# Patient Record
Sex: Female | Born: 1965 | Race: White | Hispanic: No | Marital: Married | Smoking: Former smoker
Health system: Southern US, Community
[De-identification: ages and names within clinical notes are randomized; demographics above are authoritative.]

## PROBLEM LIST (undated history)

## (undated) DIAGNOSIS — R609 Edema, unspecified: Secondary | ICD-10-CM

## (undated) DIAGNOSIS — R000 Tachycardia, unspecified: Secondary | ICD-10-CM

## (undated) DIAGNOSIS — M549 Dorsalgia, unspecified: Secondary | ICD-10-CM

## (undated) DIAGNOSIS — R12 Heartburn: Secondary | ICD-10-CM

## (undated) DIAGNOSIS — E785 Hyperlipidemia, unspecified: Secondary | ICD-10-CM

## (undated) DIAGNOSIS — G4700 Insomnia, unspecified: Secondary | ICD-10-CM

## (undated) DIAGNOSIS — R079 Chest pain, unspecified: Secondary | ICD-10-CM

## (undated) DIAGNOSIS — E079 Disorder of thyroid, unspecified: Secondary | ICD-10-CM

## (undated) DIAGNOSIS — J309 Allergic rhinitis, unspecified: Secondary | ICD-10-CM

## (undated) HISTORY — DX: Dorsalgia, unspecified: M54.9

## (undated) HISTORY — DX: Insomnia, unspecified: G47.00

## (undated) HISTORY — DX: Hyperlipidemia, unspecified: E78.5

## (undated) HISTORY — DX: Tachycardia, unspecified: R00.0

## (undated) HISTORY — DX: Edema, unspecified: R60.9

## (undated) HISTORY — PX: WISDOM TOOTH EXTRACTION: SHX21

## (undated) HISTORY — DX: Disorder of thyroid, unspecified: E07.9

## (undated) HISTORY — DX: Chest pain, unspecified: R07.9

## (undated) HISTORY — DX: Allergic rhinitis, unspecified: J30.9

## (undated) HISTORY — DX: Heartburn: R12

---
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
## 1998-03-21 ENCOUNTER — Other Ambulatory Visit: Admission: RE | Admit: 1998-03-21 | Discharge: 1998-03-21 | Payer: Self-pay | Admitting: Obstetrics and Gynecology

## 1998-10-16 ENCOUNTER — Inpatient Hospital Stay (HOSPITAL_COMMUNITY): Admission: AD | Admit: 1998-10-16 | Discharge: 1998-10-20 | Payer: Self-pay | Admitting: Obstetrics and Gynecology

## 1998-12-01 ENCOUNTER — Other Ambulatory Visit: Admission: RE | Admit: 1998-12-01 | Discharge: 1998-12-01 | Payer: Self-pay | Admitting: Obstetrics and Gynecology

## 2000-03-30 ENCOUNTER — Other Ambulatory Visit: Admission: RE | Admit: 2000-03-30 | Discharge: 2000-03-30 | Payer: Self-pay | Admitting: Obstetrics and Gynecology

## 2000-09-18 ENCOUNTER — Emergency Department (HOSPITAL_COMMUNITY): Admission: EM | Admit: 2000-09-18 | Discharge: 2000-09-18 | Payer: Self-pay | Admitting: Emergency Medicine

## 2001-06-09 ENCOUNTER — Other Ambulatory Visit: Admission: RE | Admit: 2001-06-09 | Discharge: 2001-06-09 | Payer: Self-pay | Admitting: Obstetrics and Gynecology

## 2002-10-04 ENCOUNTER — Other Ambulatory Visit: Admission: RE | Admit: 2002-10-04 | Discharge: 2002-10-04 | Payer: Self-pay | Admitting: Obstetrics and Gynecology

## 2002-11-29 ENCOUNTER — Encounter: Payer: Self-pay | Admitting: Emergency Medicine

## 2002-11-29 ENCOUNTER — Emergency Department (HOSPITAL_COMMUNITY): Admission: EM | Admit: 2002-11-29 | Discharge: 2002-11-29 | Payer: Self-pay | Admitting: Emergency Medicine

## 2003-11-28 ENCOUNTER — Other Ambulatory Visit: Admission: RE | Admit: 2003-11-28 | Discharge: 2003-11-28 | Payer: Self-pay | Admitting: Obstetrics and Gynecology

## 2004-12-16 ENCOUNTER — Other Ambulatory Visit: Admission: RE | Admit: 2004-12-16 | Discharge: 2004-12-16 | Payer: Self-pay | Admitting: Obstetrics and Gynecology

## 2011-04-06 ENCOUNTER — Inpatient Hospital Stay (HOSPITAL_COMMUNITY)
Admission: AD | Admit: 2011-04-06 | Discharge: 2011-04-07 | Disposition: A | Discharge status: Home Health | Payer: BC Managed Care – PPO | Source: Ambulatory Visit | Attending: Obstetrics and Gynecology | Admitting: Obstetrics and Gynecology

## 2011-04-06 ENCOUNTER — Encounter (HOSPITAL_COMMUNITY): Payer: Self-pay

## 2011-04-06 DIAGNOSIS — N898 Other specified noninflammatory disorders of vagina: Secondary | ICD-10-CM

## 2011-04-06 DIAGNOSIS — R109 Unspecified abdominal pain: Secondary | ICD-10-CM | POA: Insufficient documentation

## 2011-04-06 DIAGNOSIS — N939 Abnormal uterine and vaginal bleeding, unspecified: Secondary | ICD-10-CM

## 2011-04-06 LAB — CBC
Hemoglobin: 13 g/dL (ref 12.0–15.0)
MCH: 28.1 pg (ref 26.0–34.0)
MCHC: 33.5 g/dL (ref 30.0–36.0)
MCV: 84 fL (ref 78.0–100.0)
Platelets: 223 10*3/uL (ref 150–400)
RDW: 13.4 % (ref 11.5–15.5)
WBC: 8.2 10*3/uL (ref 4.0–10.5)

## 2011-04-06 MED ORDER — NAPROXEN SODIUM 550 MG PO TABS: 550.0000 mg | ORAL_TABLET | Freq: Two times a day (BID) | ORAL | Status: AC

## 2011-04-06 MED ORDER — MEDROXYPROGESTERONE ACETATE 5 MG PO TABS: 10.0000 mg | ORAL_TABLET | Freq: Every day | ORAL | Status: DC

## 2011-04-06 NOTE — Discharge Instructions (Signed)
Dysfunctional Uterine Bleeding (DUB) (Abnormal Uterine Bleeding [AUB]) Normally menstrual periods begin between ages 9 to 42 in young women. A normal menstrual cycle/period may begin every 23 days up to 35 days and lasts from 1 to 7 days. Around 12 to 14 days before your menstrual period starts, ovulation (ovary produces an egg) occurs. When counting the time between menstrual periods, count from the first day of bleeding of the previous period to the first day of bleeding of the next period. Dysfunctional (abnormal) uterine bleeding is bleeding that is different from a normal menstrual period. Your periods may come earlier or later than usual. They may be lighter, have blood clots or be heavier. You may have bleeding between periods, or you may skip one period or more. You may have bleeding after sexual intercourse, bleeding after menopause, or no menstrual period. CAUSES  Pregnancy (normal, miscarriage, tubal).  IUDs (intrauterine device, birth control).   Birth control pills.   Hormone treatment.   Menopause.   Infection of the cervix.   Blood clotting problems.   Infection of the inside lining of the uterus.   Endometriosis, inside lining of the uterus growing in the pelvis and other female organs.   Adhesions (scar tissue) inside the uterus.   Obesity or severe weight loss.   Uterine polyps inside the uterus.  Cancer of the vagina, cervix, or uterus.   Ovarian cysts or polycystic ovary syndrome.   Medical problems (diabetes, thyroid disease).   Uterine fibroids (noncancerous tumor).   Problems with your female hormones.   Endometrial hyperplasia, very thick lining and enlarged cells inside of the uterus.   Medicines that interfere with ovulation.   Radiation to the pelvis or abdomen.   Chemotherapy.   DIAGNOSIS  Your doctor will discuss the history of your menstrual periods, medicines you are taking, changes in your weight, stress in your life, and any medical  problems you may have.   Your doctor will do a physical and pelvic examination.   Your doctor may want to perform certain tests to make a diagnosis, such as:   Pap test.  Blood tests.   Cultures for infection.   CT scan.   Ultrasound.  Hysteroscopy.   Laparoscopy.   MRI.   Hysterosalpingography.  D and C.   Endometrial biopsy.   TREATMENT Treatment will depend on the cause of the dysfunctional uterine bleeding (DUB). Treatment may include:  Observing your menstrual periods for a couple of months.   Prescribing medicines for medical problems, including:   Antibiotics.   Hormones.   Birth control pills.   Removing an IUD (intrauterine device, birth control).   Surgery:   D and C (scrape and remove tissue from inside the uterus).   Laparoscopy (examine inside the abdomen with a lighted tube).   Uterine ablation (destroy lining of the uterus with electrical current, laser, heat, or freezing).   Hysteroscopy (examine cervix and uterus with a lighted tube).   Hysterectomy (remove the uterus).  HOME CARE INSTRUCTIONS  If medicines were prescribed, take exactly as directed. Do not change or switch medicines without consulting your caregiver.   Long term heavy bleeding may result in iron deficiency. Your caregiver may have prescribed iron pills. They help replace the iron that your body lost from heavy bleeding. Take exactly as directed.   Do not take aspirin or medicines that contain aspirin one week before or during your menstrual period. Aspirin may make the bleeding worse.   If you need to change  your sanitary pad or tampon more than once every 2 hours, stay in bed with your feet elevated and a cold pack on your lower abdomen. Rest as much as possible, until the bleeding stops or slows down.   Eat well-balanced meals. Eat foods high in iron. Examples are:   Leafy green vegetables.  Whole-grain breads and cereals.   Eggs.   Meat.  Liver.    Do not try  to lose weight until the abnormal bleeding has stopped and your blood iron level is back to normal. Do not lift more than ten pounds or do strenuous activities when you are bleeding.   For a couple of months, make note on your calendar, marking the start and ending of your period, and the type of bleeding (light, medium, heavy, spotting, clots or missed periods). This is for your caregiver to better evaluate your problem.  SEEK MEDICAL CARE IF:  You develop nausea (feeling sick to your stomach) and vomiting, dizziness, or diarrhea while you are taking your medicine.   You are getting lightheaded or weak.   You have any problems that may be related to the medicine you are taking.   You develop pain with your DUB.   You want to remove your IUD.   You want to stop or change your birth control pills or hormones.   You have any type of abnormal bleeding mentioned above.   You are over 6 years old and have not had a menstrual period yet.   You are 45 years old and you are still having menstrual periods.   You have any of the symptoms mentioned above.   You develop a rash.  SEEK IMMEDIATE MEDICAL CARE IF:  An oral temperature above 100.4 develops.   You develop chills.   You are changing your sanitary pad or tampon more than once an hour.   You develop abdominal pain.   You pass out or faint.  Document Released: 08/20/2000 Document Re-Released: 11/17/2009 [facility] Patient Information 2011 [place], [place].

## 2011-04-06 NOTE — ED Provider Notes (Signed)
History    patient is a 45 year old white female who presents today complaining of irregular vaginal bleeding. She had her regular menses on July 3. However she states she began bleeding yesterday and now bleeding extremely heavily with lower abdominal  cramping. She denies fever. She denies any other problems at this time.  Chief Complaint  Patient presents with  . Vaginal Bleeding   HPI  OB History    Grav Para Term Preterm Abortions TAB SAB Ect Mult Living   2 2        2       Past Medical History  Diagnosis Date  . Lymphedema of leg     right leg swelling    Past Surgical History  Procedure Date  . Wisdom tooth extraction     Family History  Problem Relation Age of Onset  . Cancer Mother   . Atrial fibrillation Mother   . Kidney disease Mother     History  Substance Use Topics  . Smoking status: Never Smoker   . Smokeless tobacco: Not on file  . Alcohol Use: No    Allergies:  Allergies  Allergen Reactions  . Penicillins Rash    Childhood reactions    Prescriptions prior to admission  Medication Sig Dispense Refill  . zolpidem (AMBIEN) 10 MG tablet Take 5 mg by mouth at bedtime as needed. sleep         Review of Systems  Constitutional: Negative for fever and chills.  Cardiovascular: Negative for chest pain.  Gastrointestinal: Positive for abdominal pain. Negative for nausea, vomiting, diarrhea and constipation.  Genitourinary: Negative for dysuria, urgency, frequency, hematuria and flank pain.  Neurological: Negative for dizziness and headaches.  Psychiatric/Behavioral: Negative for depression and suicidal ideas.   Physical Exam   Blood pressure 133/92, pulse 98, temperature 98.2 F (36.8 C), temperature source Oral, resp. rate 20, height 5\' 3"  (1.6 m), weight 141 lb (63.957 kg), last menstrual period 04/06/2011, SpO2 97.00%.  Physical Exam  Constitutional: She is oriented to person, place, and time. She appears well-developed and well-nourished.  No distress.  HENT:  Head: Normocephalic and atraumatic.  Eyes: EOM are normal. Pupils are equal, round, and reactive to light.  GI: Soft. She exhibits no distension and no mass. There is no tenderness. There is no rebound and no guarding.  Genitourinary: There is bleeding around the vagina. No tenderness around the vagina. No vaginal discharge found.       Uterus is top normal size. No adnexal masses. She has moderate bleeding on exam.  Neurological: She is alert and oriented to person, place, and time.  Skin: Skin is warm and dry. She is not diaphoretic.  Psychiatric: She has a normal mood and affect. Her behavior is normal. Judgment and thought content normal.    MAU Course  Procedures  Results for orders placed during the hospital encounter of 04/06/11 (from the past 24 hour(s))  CBC     Status: Normal   Collection Time   04/06/11 10:36 PM      Component Value Range   WBC 8.2  4.0 - 10.5 (K/uL)   RBC 4.62  3.87 - 5.11 (MIL/uL)   Hemoglobin 13.0  12.0 - 15.0 (g/dL)   HCT 45.4  09.8 - 11.9 (%)   MCV 84.0  78.0 - 100.0 (fL)   MCH 28.1  26.0 - 34.0 (pg)   MCHC 33.5  30.0 - 36.0 (g/dL)   RDW 14.7  82.9 - 56.2 (%)   Platelets  223  150 - 400 (K/uL)    Assessment and Plan  Irregular vaginal bleeding: Give her prescription for Provera 10 mg take 1 by mouth daily for 10 days. She'll followup with her OB/GYN provider. Also did a prescription for double strength Anaprox to take as needed for pain. She'll return immediately if she begins to have any worsening symptoms or problems.  Clinton Gallant. Lexie Koehl III, DrHSc, MPAS, PA-C  04/06/2011, 11:23 PM   Henrietta Hoover, PA 04/06/11 [id number]

## 2011-04-06 NOTE — Progress Notes (Signed)
Patient is here with c/o heavy vaginal bleeding with clots. She states that it at 1700pm today. She states that she is saturated super tampon and pad every . She is feeling slightly lighheaded. She also c/o moderate lower abdominal cramping.

## 2011-04-06 NOTE — Progress Notes (Signed)
Pt states her last period was Jul 3-states she has started her period now but is hav in heavy bleeding and passing clots

## 2013-01-17 ENCOUNTER — Other Ambulatory Visit: Payer: Self-pay | Admitting: Obstetrics and Gynecology

## 2013-01-17 DIAGNOSIS — N6320 Unspecified lump in the left breast, unspecified quadrant: Secondary | ICD-10-CM

## 2013-01-31 ENCOUNTER — Ambulatory Visit
Admission: RE | Admit: 2013-01-31 | Discharge: 2013-01-31 | Disposition: A | Discharge status: Home / Self Care | Payer: BC Managed Care – PPO | Source: Ambulatory Visit | Attending: Obstetrics and Gynecology | Admitting: Obstetrics and Gynecology

## 2013-01-31 ENCOUNTER — Other Ambulatory Visit: Payer: Self-pay | Admitting: Obstetrics and Gynecology

## 2013-01-31 DIAGNOSIS — N6320 Unspecified lump in the left breast, unspecified quadrant: Secondary | ICD-10-CM

## 2013-02-01 ENCOUNTER — Ambulatory Visit
Admission: RE | Admit: 2013-02-01 | Discharge: 2013-02-01 | Disposition: A | Discharge status: Home / Self Care | Payer: BC Managed Care – PPO | Source: Ambulatory Visit | Attending: Obstetrics and Gynecology | Admitting: Obstetrics and Gynecology

## 2013-02-01 DIAGNOSIS — N6320 Unspecified lump in the left breast, unspecified quadrant: Secondary | ICD-10-CM

## 2013-06-21 ENCOUNTER — Encounter: Payer: Self-pay | Admitting: *Deleted

## 2013-06-21 ENCOUNTER — Encounter: Payer: Self-pay | Admitting: Cardiology

## 2013-06-21 DIAGNOSIS — R609 Edema, unspecified: Secondary | ICD-10-CM | POA: Insufficient documentation

## 2013-06-21 DIAGNOSIS — M549 Dorsalgia, unspecified: Secondary | ICD-10-CM | POA: Insufficient documentation

## 2013-06-21 DIAGNOSIS — G4700 Insomnia, unspecified: Secondary | ICD-10-CM | POA: Insufficient documentation

## 2013-06-21 DIAGNOSIS — J309 Allergic rhinitis, unspecified: Secondary | ICD-10-CM | POA: Insufficient documentation

## 2013-06-25 ENCOUNTER — Ambulatory Visit: Payer: BC Managed Care – PPO | Admitting: Cardiology

## 2014-02-25 ENCOUNTER — Other Ambulatory Visit: Payer: Self-pay | Admitting: Obstetrics and Gynecology

## 2014-02-25 DIAGNOSIS — N63 Unspecified lump in unspecified breast: Secondary | ICD-10-CM

## 2014-03-15 ENCOUNTER — Ambulatory Visit
Admission: RE | Admit: 2014-03-15 | Discharge: 2014-03-15 | Disposition: A | Discharge status: Home / Self Care | Payer: BC Managed Care – PPO | Source: Ambulatory Visit | Attending: Obstetrics and Gynecology | Admitting: Obstetrics and Gynecology

## 2014-03-15 ENCOUNTER — Other Ambulatory Visit: Payer: Self-pay | Admitting: Obstetrics and Gynecology

## 2014-03-15 DIAGNOSIS — N63 Unspecified lump in unspecified breast: Secondary | ICD-10-CM

## 2014-04-01 ENCOUNTER — Ambulatory Visit: Payer: BC Managed Care – PPO

## 2014-04-08 ENCOUNTER — Ambulatory Visit: Payer: BC Managed Care – PPO | Attending: Orthopaedic Surgery

## 2014-04-12 ENCOUNTER — Telehealth: Payer: Self-pay | Admitting: Genetic Counselor

## 2014-04-12 NOTE — Telephone Encounter (Signed)
LEFT MESSAGE FOR PATIENT TO RETURN CALL TO SCHEDULE GENETIC APPT.  °

## 2014-04-16 ENCOUNTER — Ambulatory Visit (INDEPENDENT_AMBULATORY_CARE_PROVIDER_SITE_OTHER): Payer: BC Managed Care – PPO | Admitting: Physical Therapy

## 2014-04-16 DIAGNOSIS — M545 Low back pain, unspecified: Secondary | ICD-10-CM

## 2014-04-16 DIAGNOSIS — R293 Abnormal posture: Secondary | ICD-10-CM

## 2014-04-16 DIAGNOSIS — M6281 Muscle weakness (generalized): Secondary | ICD-10-CM

## 2014-04-16 DIAGNOSIS — M546 Pain in thoracic spine: Secondary | ICD-10-CM

## 2014-04-18 ENCOUNTER — Encounter (INDEPENDENT_AMBULATORY_CARE_PROVIDER_SITE_OTHER): Payer: BC Managed Care – PPO

## 2014-04-18 DIAGNOSIS — M545 Low back pain, unspecified: Secondary | ICD-10-CM

## 2014-04-18 DIAGNOSIS — M6281 Muscle weakness (generalized): Secondary | ICD-10-CM

## 2014-04-18 DIAGNOSIS — M546 Pain in thoracic spine: Secondary | ICD-10-CM

## 2014-04-18 DIAGNOSIS — R293 Abnormal posture: Secondary | ICD-10-CM

## 2014-04-18 DIAGNOSIS — M542 Cervicalgia: Secondary | ICD-10-CM

## 2014-04-23 ENCOUNTER — Encounter (INDEPENDENT_AMBULATORY_CARE_PROVIDER_SITE_OTHER): Payer: BC Managed Care – PPO | Admitting: Physical Therapy

## 2014-04-23 DIAGNOSIS — M545 Low back pain, unspecified: Secondary | ICD-10-CM

## 2014-04-23 DIAGNOSIS — M546 Pain in thoracic spine: Secondary | ICD-10-CM

## 2014-04-23 DIAGNOSIS — R293 Abnormal posture: Secondary | ICD-10-CM

## 2014-04-23 DIAGNOSIS — M6281 Muscle weakness (generalized): Secondary | ICD-10-CM

## 2014-04-23 DIAGNOSIS — M542 Cervicalgia: Secondary | ICD-10-CM

## 2014-04-25 ENCOUNTER — Encounter (INDEPENDENT_AMBULATORY_CARE_PROVIDER_SITE_OTHER): Payer: BC Managed Care – PPO | Admitting: Physical Therapy

## 2014-04-25 DIAGNOSIS — M545 Low back pain, unspecified: Secondary | ICD-10-CM

## 2014-04-25 DIAGNOSIS — M542 Cervicalgia: Secondary | ICD-10-CM

## 2014-04-25 DIAGNOSIS — M6020 Foreign body granuloma of soft tissue, not elsewhere classified, unspecified site: Secondary | ICD-10-CM

## 2014-04-25 DIAGNOSIS — M546 Pain in thoracic spine: Secondary | ICD-10-CM

## 2014-04-25 DIAGNOSIS — R293 Abnormal posture: Secondary | ICD-10-CM

## 2014-04-30 ENCOUNTER — Encounter (INDEPENDENT_AMBULATORY_CARE_PROVIDER_SITE_OTHER): Payer: BC Managed Care – PPO | Admitting: Physical Therapy

## 2014-04-30 DIAGNOSIS — M6281 Muscle weakness (generalized): Secondary | ICD-10-CM

## 2014-04-30 DIAGNOSIS — M545 Low back pain, unspecified: Secondary | ICD-10-CM

## 2014-04-30 DIAGNOSIS — M542 Cervicalgia: Secondary | ICD-10-CM

## 2014-04-30 DIAGNOSIS — R293 Abnormal posture: Secondary | ICD-10-CM

## 2014-04-30 DIAGNOSIS — M546 Pain in thoracic spine: Secondary | ICD-10-CM

## 2014-05-02 ENCOUNTER — Encounter: Payer: BC Managed Care – PPO | Admitting: Physical Therapy

## 2014-05-09 ENCOUNTER — Ambulatory Visit (HOSPITAL_BASED_OUTPATIENT_CLINIC_OR_DEPARTMENT_OTHER): Payer: BC Managed Care – PPO | Admitting: Genetic Counselor

## 2014-05-09 ENCOUNTER — Other Ambulatory Visit: Payer: BC Managed Care – PPO

## 2014-05-09 ENCOUNTER — Encounter: Payer: BC Managed Care – PPO | Admitting: Physical Therapy

## 2014-05-09 DIAGNOSIS — IMO0002 Reserved for concepts with insufficient information to code with codable children: Secondary | ICD-10-CM

## 2014-05-09 DIAGNOSIS — Z803 Family history of malignant neoplasm of breast: Secondary | ICD-10-CM

## 2014-05-09 DIAGNOSIS — Z8041 Family history of malignant neoplasm of ovary: Secondary | ICD-10-CM

## 2014-05-09 NOTE — Progress Notes (Signed)
HISTORY OF PRESENT ILLNESS: Dr. Lyda Jester requested a cancer genetics consultation for Charlotte Velazquez, a 48 y.o. female, due to a family history of cancer.  Charlotte Velazquez presents to clinic today to discuss the possibility of a hereditary predisposition to cancer, genetic testing, and to further clarify her future cancer risks, as well as potential cancer risk for family members.  Charlotte Velazquez has no personal history of cancer.   Past Medical History  Diagnosis Date   Allergic rhinitis     right leg swelling   Edema    Back pain    Insomnia     Past Surgical History  Procedure Laterality Date   Wisdom tooth extraction      HORMONAL RISK FACTORS: Menarche was at age 57 First live birth at age 41  OCP use: approximately 30 years Ovaries intact: yes Hysterectomy: no Menopausal status: premenopausal Colonoscopy: n/a; not examined UTD mammogram: yes Number of breast biopsies: 1 UTD pelvic exam:  yes Excessive radiation exposure:  no  History   Social History   Marital Status: Married    Spouse Name: N/A    Number of Children: N/A   Years of Education: N/A   Social History Main Topics   Smoking status: Never Smoker    Smokeless tobacco: Not on file   Alcohol Use: No   Drug Use: No   Sexual Activity: Yes    Copy: None   Other Topics Concern   Not on file   Social History Narrative   No narrative on file     FAMILY HISTORY:  During the visit, a 4-generation pedigree was obtained. Significant diagnoses include the following:  Family History  Problem Relation Age of Onset   Cancer Mother 18    breast   Atrial fibrillation Mother    Kidney disease Mother    Cancer Paternal Aunt 74    ovarian   Cancer Other     paternal great aunt (through [facility]) with ovarian cancer and several 1st cousins once removed (also through [facility]) with breast cancer    Charlotte Velazquez's ancestry is of Caucasian descent. There is no known Jewish ancestry or  consanguinity.  GENETIC COUNSELING ASSESSMENT: Charlotte Velazquez is a 48 y.o. female with a paternal family history of cancer suggestive of a hereditary predisposition to cancer. We, therefore, discussed and recommended the following at today's visit.   DISCUSSION: We reviewed the characteristics, features and inheritance patterns of hereditary cancer syndromes. We also discussed genetic testing, including the appropriate family members to test, the process of testing, insurance coverage and turn-around-time for results. We discussed the implications of a negative, positive and/or variant of uncertain significant result. We recommended Charlotte Velazquez pursue genetic testing for the OvaNext gene panel.   PLAN: Based on our above recommendation, Charlotte Velazquez wished to pursue genetic testing and the blood sample was drawn and will be sent to [facility] for analysis. Results should be available within approximately 5 weeks time, at which point they will be disclosed by telephone to Charlotte Velazquez, as will any additional recommendations warranted by these results. We also encouraged Charlotte Velazquez to remain in contact with cancer genetics annually so that we can continuously update the family history and inform her of any changes in cancer genetics and testing that may be of benefit for this family. Ms.  Velazquez questions were answered to her satisfaction today. Our contact information was provided should additional questions or concerns arise.   Thank you for the referral  and allowing [place] to share in the care of your patient.   The patient was seen for a total of 55 minutes in face-to-face genetic counseling.  This patient was discussed with Dr. Darnelle Catalan who agrees with the above.    _______________________________________________________________________ For Office Staff:  Number of people involved in session: 2 Was an Intern/ student involved with case: not applicable

## 2014-05-10 ENCOUNTER — Encounter (INDEPENDENT_AMBULATORY_CARE_PROVIDER_SITE_OTHER): Payer: BC Managed Care – PPO | Admitting: Physical Therapy

## 2014-05-10 DIAGNOSIS — M542 Cervicalgia: Secondary | ICD-10-CM

## 2014-05-10 DIAGNOSIS — M545 Low back pain, unspecified: Secondary | ICD-10-CM

## 2014-05-10 DIAGNOSIS — M546 Pain in thoracic spine: Secondary | ICD-10-CM

## 2014-05-10 DIAGNOSIS — R293 Abnormal posture: Secondary | ICD-10-CM

## 2014-05-10 DIAGNOSIS — M6281 Muscle weakness (generalized): Secondary | ICD-10-CM

## 2014-06-05 ENCOUNTER — Encounter: Payer: Self-pay | Admitting: Genetic Counselor

## 2014-06-05 DIAGNOSIS — Z8041 Family history of malignant neoplasm of ovary: Secondary | ICD-10-CM

## 2014-06-05 DIAGNOSIS — Z803 Family history of malignant neoplasm of breast: Secondary | ICD-10-CM

## 2014-06-05 NOTE — Progress Notes (Signed)
HPI:  Ms. Mihalic was previously seen in the [place] clinic due to a family history of cancer and concerns regarding a hereditary predisposition to cancer. Please refer to our prior cancer genetics clinic note for more information regarding Ms. Veron's medical, social and family histories, and our assessment and recommendations, at the time. Ms. Mikulski recent genetic test results were disclosed to her, as were recommendations warranted by these results. These results and recommendations are discussed in more detail below.  GENETIC TEST RESULTS: At the time of Ms. Foree's visit, we recommended she pursue genetic testing of the OvaNext gene panel which looks at several genes associated with an increased risk for cancer, including BRCA1 and BRCA2. This test, which included sequencing and deletion/duplication analysis of the genes, was performed at [facility]. Genetic testing was normal, and did not reveal a deleterious mutation in these genes. A complete list of all genes tested is located on the test report scanned into EPIC.    We discussed with Ms. Rawlins that since the current genetic testing is not perfect, it is possible there may be a gene mutation in one of these genes that current testing cannot detect, but that chance is small.  We also discussed, that it is possible that another gene that has not yet been discovered, or that we have not yet tested, is responsible for the cancer diagnoses in the family, and it is, therefore, important to remain in touch with cancer genetics in the future so that we can continue to offer Ms. Tucker the most up to date genetic testing.   CANCER SCREENING RECOMMENDATIONS:This normal result is reassuring and indicates that Ms. Mazurkiewicz does not likely have an increased risk of cancer due to a a mutation in one of these genes.  We, therefore, recommended  Ms. Fee continue to follow the cancer screening guidelines  provided by her primary healthcare providers.   RECOMMENDATIONS FOR FAMILY MEMBERS:  While these results are reassuring for Ms. Bontempo, this test does not tell us anything about Ms. Nasca's paternal relatives' risks. We recommended these relatives also have genetic counseling and testing. Please let us know if we can help facilitate testing. Genetic counselors can be located in other cities, by visiting the website of the [employer] (ArtistMovie.se) and Field seismologist for a Dietitian by zip code.  FOLLOW-UP: Lastly, we discussed with Ms. Aday that cancer genetics is a rapidly advancing field and it is possible that new genetic tests will be appropriate for her and/or her family members in the future. We encouraged her to remain in contact with cancer genetics on an annual basis so we can update her personal and family histories and let her know of advances in cancer genetics that may benefit this family.   Our contact number was provided. Ms. Willaims questions were answered to her satisfaction, and she knows she is welcome to call us at anytime with additional questions or concerns.   Catherine A. Fine, MS, CGC Certified Genetic Counseor [email or web address]

## 2014-10-07 HISTORY — PX: BREAST SURGERY: SHX581

## 2014-10-11 ENCOUNTER — Telehealth: Payer: Self-pay | Admitting: Cardiology

## 2014-10-11 NOTE — Telephone Encounter (Signed)
New Message     Patient is calling back to speak with RN. Please give a call back.

## 2014-10-11 NOTE — Telephone Encounter (Signed)
Follow Up  Pt called back to discuss root canal.. Is she ok to take antibiotics if needed. Sent message to triage. Triage unable to determine because al;l notes are not on file

## 2014-10-11 NOTE — Telephone Encounter (Signed)
Patient is having a breast reduction next Wednesday. Patient requests personal clearance from Dr. Mayford Knifeurner that she is OK for surgery.  To Dr. Mayford Knifeurner.

## 2014-10-11 NOTE — Telephone Encounter (Signed)
New Message  Pt wanted to speak w/ Rn about root canal for today 2/5. Please call back and discuss

## 2014-10-12 NOTE — Telephone Encounter (Signed)
When was the [facility] time [facility] - [facility]

## 2014-10-14 NOTE — Telephone Encounter (Signed)
Left message to call back  

## 2014-10-14 NOTE — Telephone Encounter (Signed)
I have not seen the patient in almost 2 years.  If she is having cardiac problems or needs preop clearance she needs to see me

## 2014-10-14 NOTE — Telephone Encounter (Signed)
Patient refuses OV with Dr. Mayford Knifeurner as she is completely asymptomatic.  She st as of now, her MD is not requesting pre-operative clearance. Patient st she is going to proceed without Dr. Norris Crossurner's clearance for now.

## 2014-10-14 NOTE — Telephone Encounter (Signed)
[facility]. OV notes available in "records" file folder.

## 2016-01-06 ENCOUNTER — Encounter: Payer: Self-pay | Admitting: Cardiology

## 2016-01-06 ENCOUNTER — Ambulatory Visit (INDEPENDENT_AMBULATORY_CARE_PROVIDER_SITE_OTHER): Payer: BLUE CROSS/BLUE SHIELD | Admitting: Cardiology

## 2016-01-06 VITALS — BP 128/80 | HR 88 | Ht 63.0 in | Wt 142.8 lb

## 2016-01-06 DIAGNOSIS — R002 Palpitations: Secondary | ICD-10-CM | POA: Diagnosis not present

## 2016-01-06 DIAGNOSIS — R079 Chest pain, unspecified: Secondary | ICD-10-CM

## 2016-01-06 NOTE — Progress Notes (Signed)
Cardiology Office Note    Date:  01/06/2016   ID:  [name], DOB [birth year], MRN [id number]  PCP:  No primary care provider on file.  Cardiologist:  Quintella Reichert, MD   Chief Complaint  Patient presents with  . Chest Pain  . Palpitations    History of Present Illness:  Charlotte Velazquez is a 50 y.o. female who presents today for evaluation of .  She has a strong family history of ASCAD and her mother has had atrial fibrillation.  She is a former smoker.  She was seen in 2014 with CP and SOB and nuclear stress test showed no ischemia and echo showed normal LVF.  She is now having CP again.  Her brother and mother passed away within the last year.  Her Dad has had some cardiac issues lately and had CABG.  The last time she had CP and SOB her mom was having problems with afib so she is not sure it it is just stress.  She says that the CP started around the beginning of February and she describes it as palpitations and at time skips a beat.  She also notices a dull ache in the left upper chest and then get left arm numbness that is intermittent but would be off and on for a few hours.  It is nonexertional.  Prior to her Dad having his MI she was in the gym 4 days weekly doing hard impact aerobics without any problems and then after her Dad has his MI she started noticing her symptoms.  She has noticed some DOE when she feels her heart beating hard.  She has no diaphoresis with her Cp.  She denies any dizziness or syncope.  She has lymphedema in RLE that is chronic.  EKG done at PCP office 12/05/2015 showed NSR at 64bpm with no ST changes.    Past Medical History  Diagnosis Date  . Allergic rhinitis     right leg swelling  . Edema   . Back pain   . Insomnia   . Back pain   . Racing heart beat   . Heart burn   . Chest pain     Past Surgical History  Procedure Laterality Date  . Wisdom tooth extraction      Current Medications: Outpatient Prescriptions Prior to Visit    Medication Sig Dispense Refill  . thyroid (ARMOUR) 32.5 MG tablet Take 32.5 mg by mouth daily.    Marland Kitchen liothyronine (CYTOMEL) 5 MCG tablet Take 5 mcg by mouth daily.    Marland Kitchen zolpidem (AMBIEN) 5 MG tablet Take 5 mg by mouth at bedtime as needed for sleep.     No facility-administered medications prior to visit.     Allergies:   Penicillins   Social History   Social History  . Marital Status: Married    Spouse Name: N/A  . Number of Children: N/A  . Years of Education: N/A   Social History Main Topics  . Smoking status: Former Games developer  . Smokeless tobacco: None  . Alcohol Use: No  . Drug Use: No  . Sexual Activity: Yes    Birth Control/ Protection: None   Other Topics Concern  . None   Social History Narrative     Family History:  The patient's family history includes Atrial fibrillation in her mother; Cancer in her other; Cancer (age of onset: 47) in her paternal aunt; Cancer (age of onset: 27) in her mother; Heart attack in her father, maternal  grandfather, and paternal grandfather; Kidney disease in her mother.   ROS:   Please see the history of present illness.    ROS All other systems reviewed and are negative.   PHYSICAL EXAM:   VS:  BP 128/80 mmHg  Pulse 88  Ht 5\' 3"  (1.6 m)  Wt 142 lb 12.8 oz (64.774 kg)  BMI 25.30 kg/m2   GEN: Well nourished, well developed, in no acute distress HEENT: normal Neck: no JVD, carotid bruits, or masses Cardiac: RRR; no murmurs, rubs, or gallops,no edema.  Intact distal pulses bilaterally.  Respiratory:  clear to auscultation bilaterally, normal work of breathing GI: soft, nontender, nondistended, + BS MS: no deformity or atrophy Skin: warm and dry, no rash Neuro:  Alert and Oriented x 3, Strength and sensation are intact Psych: euthymic mood, full affect  Wt Readings from Last 3 Encounters:  01/06/16 142 lb 12.8 oz (64.774 kg)  04/06/11 141 lb (63.957 kg)      Studies/Labs Reviewed:   EKG:  EKG is  ordered today.  The ekg  ordered today demonstrates   Recent Labs: No results found for requested labs within last 365 days.   Lipid Panel No results found for: CHOL, TRIG, HDL, CHOLHDL, VLDL, LDLCALC, LDLDIRECT  Additional studies/ records that were reviewed today include:  Office notes from PCP      ASSESSMENT:    1. Chest pain, unspecified chest pain type   2. Palpitations      PLAN:  In order of problems listed above:  1. Chest pain - sounds atypical and I suspect is due to anxiety and stress over multiple family issues in the last year with health.  Her brother and mother died this past year and her Dad just had an MI and CABG.  Up until his MI, she had been working out hard at the gym at least 4 times weekly without any problems and this changed and CP started right after her Dad's MI.  Her EKG was nonischemic in her PCP office and she had a normal nuclear stress test in 2014.  I will get an ETT to assess for ischemia.  She wants to hold off on echo due to out of pocket costs.   2. Palpitations which could be related to stress, peri-menopause symptoms or PAF.  She does have a family history of PAF with her mom.  Will get a 30 day event  Monitor to assess further.   Followup PRN pending results of studies.    Medication Adjustments/Labs and Tests Ordered: Current medicines are reviewed at length with the patient today.  Concerns regarding medicines are outlined above.  Medication changes, Labs and Tests ordered today are listed in the Patient Instructions below.   Harlon FlorSigned, Michalle Rademaker R, MD  01/06/2016 12:55 PM    [facility] [address], [place], [place]  [address] Phone: [phone]; Fax: [phone]

## 2016-01-06 NOTE — Patient Instructions (Signed)
Medication Instructions:  Your physician recommends that you continue on your current medications as directed. Please refer to the Current Medication list given to you today.   Labwork: None  Testing/Procedures: Your physician has recommended that you wear an event monitor. Event monitors are medical devices that record the heart's electrical activity. Doctors most often us these monitors to diagnose arrhythmias. Arrhythmias are problems with the speed or rhythm of the heartbeat. The monitor is a small, portable device. You can wear one while you do your normal daily activities. This is usually used to diagnose what is causing palpitations/syncope (passing out).   Your physician has requested that you have an exercise tolerance test. For further information please visit [email or web address] Please also follow instruction sheet, as given.  Follow-Up: Your physician recommends that you schedule a follow-up appointment AS NEEDED with Dr. Mayford Knifeurner pending study results.  Any Other Special Instructions Will Be Listed Below (If Applicable).     If you need a refill on your cardiac medications before your next appointment, please call your pharmacy.

## 2016-01-08 ENCOUNTER — Telehealth: Payer: Self-pay | Admitting: Cardiology

## 2016-01-08 NOTE — Telephone Encounter (Signed)
Patient does not have PPM/ICD.  Routed to billing manager for assistance.

## 2016-01-08 NOTE — Telephone Encounter (Signed)
New message      The patient is calling about heart monitor the pt has a question about the billing code so they have a understand what is being billed to their insurance company

## 2016-01-21 ENCOUNTER — Ambulatory Visit (INDEPENDENT_AMBULATORY_CARE_PROVIDER_SITE_OTHER): Payer: BLUE CROSS/BLUE SHIELD

## 2016-01-21 DIAGNOSIS — R079 Chest pain, unspecified: Secondary | ICD-10-CM

## 2016-01-21 DIAGNOSIS — R002 Palpitations: Secondary | ICD-10-CM

## 2016-01-21 LAB — EXERCISE TOLERANCE TEST
CHL CUP MPHR: 173 {beats}/min
CHL CUP STRESS STAGE 1 DBP: 58 mmHg
CHL CUP STRESS STAGE 1 GRADE: 0 %
CHL CUP STRESS STAGE 1 HR: 78 {beats}/min
CHL CUP STRESS STAGE 1 SPEED: 0 mph
CHL CUP STRESS STAGE 2 HR: 92 {beats}/min
CHL CUP STRESS STAGE 3 HR: 93 {beats}/min
CHL CUP STRESS STAGE 3 SPEED: 1 mph
CHL CUP STRESS STAGE 4 DBP: 70 mmHg
CHL CUP STRESS STAGE 4 GRADE: 10 %
CHL CUP STRESS STAGE 4 SBP: 147 mmHg
CHL CUP STRESS STAGE 4 SPEED: 1.7 mph
CHL CUP STRESS STAGE 5 DBP: 74 mmHg
CHL CUP STRESS STAGE 5 GRADE: 12 %
CHL CUP STRESS STAGE 5 HR: 142 {beats}/min
CHL CUP STRESS STAGE 5 SBP: 161 mmHg
CHL CUP STRESS STAGE 6 DBP: 81 mmHg
CHL CUP STRESS STAGE 7 SBP: 146 mmHg
CHL CUP STRESS STAGE 7 SPEED: 1.5 mph
CHL CUP STRESS STAGE 8 GRADE: 0 %
CHL CUP STRESS STAGE 8 SPEED: 0 mph
CSEPEW: 10.1 METS
CSEPHR: 97 %
CSEPPBP: 167 mmHg
CSEPPHR: 169 {beats}/min
Exercise duration (min): 9 min
Exercise duration (sec): 0 s
Percent of predicted max HR: 97 %
RPE: 17
Rest HR: 67 {beats}/min
Stage 1 SBP: 118 mmHg
Stage 2 Grade: 0 %
Stage 2 Speed: 1 mph
Stage 3 Grade: 0 %
Stage 4 HR: 115 {beats}/min
Stage 5 Speed: 2.5 mph
Stage 6 Grade: 14 %
Stage 6 HR: 169 {beats}/min
Stage 6 SBP: 167 mmHg
Stage 6 Speed: 3.4 mph
Stage 7 DBP: 81 mmHg
Stage 7 Grade: 0 %
Stage 7 HR: 142 {beats}/min
Stage 8 DBP: 76 mmHg
Stage 8 HR: 93 {beats}/min
Stage 8 SBP: 114 mmHg

## 2016-03-03 ENCOUNTER — Other Ambulatory Visit: Payer: Self-pay | Admitting: Obstetrics and Gynecology

## 2016-03-03 DIAGNOSIS — Z803 Family history of malignant neoplasm of breast: Secondary | ICD-10-CM

## 2016-03-03 DIAGNOSIS — Z9889 Other specified postprocedural states: Secondary | ICD-10-CM

## 2016-03-03 DIAGNOSIS — N644 Mastodynia: Secondary | ICD-10-CM

## 2016-03-04 ENCOUNTER — Telehealth: Payer: Self-pay | Admitting: Cardiology

## 2016-03-04 NOTE — Telephone Encounter (Signed)
Informed patient of results and verbal understanding expressed.  Patient st she does not think she worked out at all during the time she wore the monitor, but she was packing and travelling on May 18. She reports she had no symptoms while wearing the monitor that brought her to cardiology in the first place. She understands she will be called if Dr. Mayford Knifeurner has further recommendations.  See result note for further details.

## 2016-03-04 NOTE — Telephone Encounter (Signed)
New message     The pt calling to speak with the nurse about test results, from 30 day monitor

## 2016-03-04 NOTE — Telephone Encounter (Signed)
-----   Message from Quintella Reichertraci R Turner, MD sent at 03/03/2016  1:34 PM EDT ----- Normal heart monitor except for an episodes of sinus tach at 155bpm.  Please find out if patient works out and if so at what time of day

## 2016-03-08 ENCOUNTER — Ambulatory Visit
Admission: RE | Admit: 2016-03-08 | Discharge: 2016-03-08 | Disposition: A | Discharge status: Home / Self Care | Payer: BLUE CROSS/BLUE SHIELD | Source: Ambulatory Visit | Attending: Obstetrics and Gynecology | Admitting: Obstetrics and Gynecology

## 2016-03-08 DIAGNOSIS — Z9889 Other specified postprocedural states: Secondary | ICD-10-CM

## 2016-03-08 DIAGNOSIS — Z803 Family history of malignant neoplasm of breast: Secondary | ICD-10-CM

## 2016-03-08 DIAGNOSIS — N644 Mastodynia: Secondary | ICD-10-CM

## 2016-06-07 ENCOUNTER — Ambulatory Visit (INDEPENDENT_AMBULATORY_CARE_PROVIDER_SITE_OTHER): Payer: BLUE CROSS/BLUE SHIELD | Admitting: Orthopaedic Surgery

## 2016-06-07 DIAGNOSIS — M545 Low back pain: Secondary | ICD-10-CM | POA: Diagnosis not present

## 2017-03-17 ENCOUNTER — Encounter: Payer: BLUE CROSS/BLUE SHIELD | Admitting: Obstetrics & Gynecology

## 2017-04-26 ENCOUNTER — Ambulatory Visit (INDEPENDENT_AMBULATORY_CARE_PROVIDER_SITE_OTHER): Payer: 59 | Admitting: Obstetrics & Gynecology

## 2017-04-26 ENCOUNTER — Encounter: Payer: Self-pay | Admitting: Obstetrics & Gynecology

## 2017-04-26 VITALS — BP 118/68 | HR 72 | Resp 14 | Ht 63.5 in | Wt 140.0 lb

## 2017-04-26 DIAGNOSIS — G4700 Insomnia, unspecified: Secondary | ICD-10-CM | POA: Diagnosis not present

## 2017-04-26 DIAGNOSIS — E7212 Methylenetetrahydrofolate reductase deficiency: Secondary | ICD-10-CM | POA: Diagnosis not present

## 2017-04-26 DIAGNOSIS — N921 Excessive and frequent menstruation with irregular cycle: Secondary | ICD-10-CM

## 2017-04-26 DIAGNOSIS — R1013 Epigastric pain: Secondary | ICD-10-CM | POA: Diagnosis not present

## 2017-04-26 DIAGNOSIS — Z1589 Genetic susceptibility to other disease: Secondary | ICD-10-CM | POA: Insufficient documentation

## 2017-04-26 NOTE — Progress Notes (Addendum)
51 y.o. G2P2 Married Caucasian Female here for new patient exam.  Prior patient at [facility].  Had exam and Pap smear in June.  Reports this was normal.  Has several questions/issues she'd like to discuss.  Cycles are about every 30 to 35 days.  Flow lasts 5-7 days.  First one or two days are heavy.  This is not a significant change for her over the past couple of years.      Going to [facility] therapy in [place].  On Armour thyroid and pregnenolone.    Reports the week before her cycle, she starts to have spotting.  This is only with intercourse.  Asks if evaluation is appropriate.  Nothing has been done thus far.  Since January, pt reports several episode of extreme discomfort at epigastric pain.  Had some bloating associated with this.  Thinks this has happened at least 6-7 times that lasted about 10-12 hours.  Chocolate seems to be a trigger.    Reports long hx with insomnia.  On treatment.  Concerned I may not write this for her.  Advised she needs to know there are no long term studies that provider long term safety data and possible risks for cognitive changes may be present with long term sleep medication use.  She is willing to accept these risks.   Family hx of ovarian and breast cancer.  Tyrer-Cusick model done with pt today.  Lifetime risk of breast cancer is 32.6%.  Yearly MRI for screening discussed.  Pt is very concerned about cost for this and does not want to proceed with anything but MMG.  Importance of 3D discussed.  Genetic testing also discussed.  She will consider.  Patient's last menstrual period was 04/06/2017.          Sexually active: Yes.    The current method of family planning is vasectomy.    Exercising: No.  The patient does not participate in regular exercise at present. Smoker:  Former smoker  Health Maintenance: Pap:  03/03/17 at [facility] History of abnormal Pap:  yes MMG:  03/08/16 [place] left breast- BIRADS 2 benign   Colonoscopy:  04/2017 with [name]- polyps per patient.  10 year follow-up.   BMD:   ?2015 at [facility]  TDaP:  Unsure  Pneumonia vaccine(s):  never Zostavax:   never Hep C testing: not indicated  Screening Labs: done in June at [facility], Hb today: same   reports that she has quit smoking. She has never used smokeless tobacco. She reports that she does not drink alcohol or use drugs.  Past Medical History:  Diagnosis Date  . Allergic rhinitis    right leg swelling  . Back pain   . Back pain   . Chest pain   . Edema   . Heart burn   . Insomnia   . Racing heart beat     Past Surgical History:  Procedure Laterality Date  . BREAST SURGERY  10/2014   reduction done by Dr. Janae Sauce  . WISDOM TOOTH EXTRACTION      Current Outpatient Prescriptions  Medication Sig Dispense Refill  . ALPRAZolam (XANAX) 0.5 MG tablet as needed.     . Cholecalciferol (VITAMIN D PO) Take by mouth once a week.     . CYCLOBENZAPRINE HCL PO Take by mouth as needed.    . meloxicam (MOBIC) 7.5 MG tablet     . Menaquinone-7 (VITAMIN K2 PO) Take by mouth.    Marland Kitchen  NON FORMULARY kavinace ultra PM    . NON FORMULARY Neuro support tablet    . Nutritional Supplements (DHEA PO) Take by mouth.    . ondansetron (ZOFRAN-ODT) 8 MG disintegrating tablet Take 8 mg by mouth 3 (three) times daily as needed. Nausea and vomiting  0  . Pregnenolone POWD by Does not apply route.    . RaNITidine HCl (ZANTAC PO) Take by mouth as needed.    . SUMAtriptan Succinate (IMITREX PO) Take by mouth as needed.    . temazepam (RESTORIL) 15 MG capsule     . thyroid (ARMOUR) 32.5 MG tablet Take 32.5 mg by mouth daily.    Marland Kitchen triamterene-hydrochlorothiazide (MAXZIDE-25) 37.5-25 MG tablet     . vitamin B-12 (CYANOCOBALAMIN) 1000 MCG tablet Take by mouth.    . zolpidem (AMBIEN) 10 MG tablet Take 5 mg by mouth at bedtime as needed. sleep  0   No current facility-administered medications for this visit.     Family  History  Problem Relation Age of Onset  . Cancer Mother 78       breast  . Atrial fibrillation Mother   . Kidney disease Mother   . Parkinson's disease Mother   . Dementia Mother   . Heart attack Father   . Heart attack Maternal Grandfather   . Heart attack Paternal Grandfather   . Cancer Paternal Aunt 35       ovarian  . Cancer Other        paternal great aunt (through [facility]) with ovarian cancer and several 1st cousins once removed (also through [facility]) with breast cancer  . Multiple sclerosis Brother     ROS:  Pertinent items are noted in HPI.  Otherwise, a comprehensive ROS was negative.  Exam:   BP 118/68 (BP Location: Right Arm, Patient Position: Sitting, Cuff Size: Normal)   Pulse 72   Resp 14   Ht 5' 3.5" (1.613 m)   Wt 140 lb (63.5 kg)   LMP 04/06/2017   BMI 24.41 kg/m     Height: 5' 3.5" (161.3 cm)  Ht Readings from Last 3 Encounters:  04/26/17 5' 3.5" (1.613 m)  01/06/16 5\' 3"  (1.6 m)  04/06/11 5\' 3"  (1.6 m)    General appearance: alert, cooperative and appears stated age Pt declines exam as just done in June, 2018.  A:  Vaginal spotting one week before cycle Epigastric pain with eating chocolate Insomnia Increased risks for breast cancer.  Lifetime calculation of 32.6% with model done today.  P:   Release of records from [facility] will be obtained today. PUS with possible SHGM recommended to evaluate for endometrial polyp Advised trial of Zantac 75mg  BID for 2-4 weeks.  If symptoms persist, would recommend GI referral/appt for possible endoscopy. Yearly MMG and MRI discussed as well as genetic testing.  Pt only wants to proceed with MMG at this time.  Release of records for colonoscopy from [name] and lab work from [facility] will be obtained.     About 30 minutes, all in face to face discussion, spent with pt.

## 2017-05-02 ENCOUNTER — Telehealth: Payer: Self-pay | Admitting: Obstetrics & Gynecology

## 2017-05-02 NOTE — Telephone Encounter (Signed)
Call placed to patient to review benefits for a ultrasound. Left voicemail requesting a return call.

## 2017-05-04 NOTE — Telephone Encounter (Signed)
Patient called and wants to reschedule her ultrasound.  She is requesting something a little later in the year.

## 2017-05-05 ENCOUNTER — Telehealth: Payer: Self-pay | Admitting: *Deleted

## 2017-05-05 NOTE — Telephone Encounter (Signed)
Left detailed message per DPR letting patient know Dr. Hyacinth MeekerMiller received pap smear results, but HR HPV testing was not done. Advised patient, per Dr. Hyacinth MeekerMiller, will do pap and HR HPV testing at [facility] next year. Advised patient to return call to office if she had any questions.   Will close encounter.

## 2017-05-10 NOTE — Telephone Encounter (Signed)
Call placed to patient to follow up and reschedule ultrasound. Left voicemail message requesting a return call.

## 2017-05-12 ENCOUNTER — Other Ambulatory Visit: Payer: 59

## 2017-05-12 ENCOUNTER — Other Ambulatory Visit: Payer: 59 | Admitting: Obstetrics & Gynecology

## 2017-05-22 DIAGNOSIS — I7090 Unspecified atherosclerosis: Secondary | ICD-10-CM | POA: Insufficient documentation

## 2017-06-23 ENCOUNTER — Other Ambulatory Visit (INDEPENDENT_AMBULATORY_CARE_PROVIDER_SITE_OTHER): Payer: Self-pay | Admitting: Orthopaedic Surgery

## 2017-09-21 ENCOUNTER — Encounter: Payer: Self-pay | Admitting: Obstetrics & Gynecology

## 2017-09-21 ENCOUNTER — Telehealth: Payer: Self-pay | Admitting: Obstetrics & Gynecology

## 2017-09-21 NOTE — Telephone Encounter (Signed)
My Zolpidem refills that I got through [facility] Sicksarol [facility] at [facility] have expired. Would you be able to contact the [facility] [employer] at [address] regarding a refill for this prescription, please?    Please contact me at [phone] for any questions.    Thank you,  Charlotte Blaseebbie

## 2017-09-21 NOTE — Telephone Encounter (Signed)
New patient to Dr.Miller 04/2017. Okay to refill Zolpidem 10 mg take 5 mg as needed for sleep?

## 2017-09-23 ENCOUNTER — Other Ambulatory Visit: Payer: Self-pay | Admitting: Obstetrics & Gynecology

## 2017-09-23 NOTE — Telephone Encounter (Signed)
Medication refill request: Zolpidem Last AEX:  04/26/17 SM Next AEX: 06/30/18 Last MMG (if hormonal medication request): 03/08/16 [place] left breast- BIRADS 2 benign  Refill authorized: please advise

## 2017-09-26 NOTE — Telephone Encounter (Signed)
RF has already been completed.

## 2017-11-04 ENCOUNTER — Other Ambulatory Visit: Payer: Self-pay | Admitting: Obstetrics & Gynecology

## 2017-11-07 NOTE — Telephone Encounter (Signed)
Medication refill request: Ambien  Last OV: 04-26-17  Next AEX: 06-30-18  Last MMG (if hormonal medication request): 03-08-16 left breast U/S WNL  Refill authorized: please advise

## 2018-03-17 ENCOUNTER — Other Ambulatory Visit: Payer: Self-pay | Admitting: Obstetrics & Gynecology

## 2018-03-17 NOTE — Telephone Encounter (Signed)
Medication refill request: Remus Lofflerambien 10mg  Last AEX:  June 2018 per visit note Next AEX: 06-30-18 Last MMG (if hormonal medication request): 2017 Refill authorized: last refill 11-08-17 #30 with 1 refill was sent. Please approve if appropriate.

## 2018-04-14 ENCOUNTER — Other Ambulatory Visit (INDEPENDENT_AMBULATORY_CARE_PROVIDER_SITE_OTHER): Payer: Self-pay | Admitting: Orthopaedic Surgery

## 2018-04-17 ENCOUNTER — Telehealth: Payer: Self-pay | Admitting: Obstetrics & Gynecology

## 2018-04-17 DIAGNOSIS — N921 Excessive and frequent menstruation with irregular cycle: Secondary | ICD-10-CM

## 2018-04-17 NOTE — Telephone Encounter (Signed)
It's ok to proceed with this but she also needs a pap and HR HPV this year as the HR HPV was never done at [facility].  I do not want to do the pap after the ultrasound is done so does need AEX scheduled or OV scheduled for pap smear as well.  Thanks.

## 2018-04-17 NOTE — Telephone Encounter (Signed)
Patient calling to reschedule PUS/SHGM that was scheduled for her last year by Dr. Hyacinth MeekerMiller.   Reports she is still having irregular spotting after intercourse and before her cycle.   Advised since it has been one year since last office visit will send message to Dr. Hyacinth MeekerMiller to confirm okay to plan ultrasound or sonohysterogram at this time.

## 2018-04-17 NOTE — Telephone Encounter (Signed)
Patient is calling to reschedule an ultrasound appointment she had to cancel.

## 2018-04-17 NOTE — Telephone Encounter (Signed)
Annual exam is scheduled for 06/30/18. Pt aware pap and ultrasound cannot be completed on the same day.  She plans to keep her appointment as scheduled.   Pelvic ultrasound scheduled for 04/27/18 at 1400 with follow up consult with Dr. Hyacinth MeekerMiller.  Order placed. Encounter closed.

## 2018-04-27 ENCOUNTER — Ambulatory Visit (INDEPENDENT_AMBULATORY_CARE_PROVIDER_SITE_OTHER): Payer: 59 | Admitting: Obstetrics & Gynecology

## 2018-04-27 ENCOUNTER — Ambulatory Visit (INDEPENDENT_AMBULATORY_CARE_PROVIDER_SITE_OTHER): Payer: 59

## 2018-04-27 VITALS — BP 94/72 | HR 80 | Ht 63.5 in | Wt 142.8 lb

## 2018-04-27 DIAGNOSIS — N921 Excessive and frequent menstruation with irregular cycle: Secondary | ICD-10-CM

## 2018-04-27 MED ORDER — NORETHINDRONE 0.35 MG PO TABS: 1.0000 | ORAL_TABLET | Freq: Every day | ORAL | 2 refills | Status: DC

## 2018-04-27 NOTE — Progress Notes (Signed)
52 y.o. G2P2 Married Caucasian female here for pelvic ultrasound due to spotting with intercourse when about week before menstrual cycle is to start.  Had an "investigational" ultrasound with [facility].  This did not show anything.  Does not have results as this was done to test new equipment.    Denies pelvic pain.  Patient's last menstrual period was 04/18/2018.  Contraception: vasectomy  Findings:  UTERUS: 8.9 x 5.0 x 4.5cm with 8 x 6mm fibroid EMS:7.407mm, symmetric ADNEXA: Left ovary: 1.8 x 1.2 x 1.6cm with 7mm follicle       Right ovary: 1.6 x 1.6 x 1.2cm with 7mm follicle CUL DE SAC: no free fluid  Discussion:  Findings reviewed with pt.  SHGM not scheduled today so she is aware there may be some additional investigation that can be done.  Has AEX in October.  Discuss with pt treatment options for her bleeding.  Decided to start with micronor.  Rx to pharmacy.  Use, risks effects, risks reviewed.  If makes not difference with irregular bleeding, will obtain endometrial biopsy when she returns in October.    Assessment:  Post coital spotting, reassuring ultrasound today Increased risks for breast cancer, has declined genetic testing at this time.  Plan:  Will start micronor now daily.  Has AEX in October.  If not change in bleeding, consider endometrial biopsy.   Plan to obtain Pap with HR HPV at [facility].  ~15 minutes spent with patient >50% of time was in face to face discussion of above.

## 2018-05-03 ENCOUNTER — Encounter: Payer: Self-pay | Admitting: Obstetrics & Gynecology

## 2018-06-28 ENCOUNTER — Telehealth: Payer: Self-pay | Admitting: Cardiology

## 2018-06-28 NOTE — Telephone Encounter (Signed)
She needs to see extender

## 2018-06-28 NOTE — Telephone Encounter (Signed)
New message  Pt c/o of Chest Pain: STAT if CP now or developed within 24 hours  1. Are you having CP right now?no   2. Are you experiencing any other symptoms (ex. SOB, nausea, vomiting, sweating)? No   3. How long have you been experiencing CP? Within the last 2 weeks   4. Is your CP continuous or coming and going? Coming and going   5. Have you taken Nitroglycerin?no   Patient states that it feels like someone is pushing on her chest.  ?

## 2018-06-28 NOTE — Telephone Encounter (Signed)
Patient has been feeling 2-3/10 chest pain that occurs for 2-3 seconds. That feels like someone is pushing on her chest. The patient denies SOB, fatigue and dizziness. These events have occurred for about 2 weeks. The chest pain occurs during relaxation. She was worried about this pain because she has a family history of heart disease.   Sending to Dr. Mayford Knife.

## 2018-06-29 NOTE — Telephone Encounter (Signed)
Spoke with the patient, she is scheduled with Azalee Course PA-C on 10/25. The patient expressed understanding and had no further questions.

## 2018-06-30 ENCOUNTER — Ambulatory Visit (INDEPENDENT_AMBULATORY_CARE_PROVIDER_SITE_OTHER): Payer: 59 | Admitting: Obstetrics & Gynecology

## 2018-06-30 ENCOUNTER — Ambulatory Visit (INDEPENDENT_AMBULATORY_CARE_PROVIDER_SITE_OTHER): Payer: 59 | Admitting: Physician Assistant

## 2018-06-30 ENCOUNTER — Encounter: Payer: Self-pay | Admitting: *Deleted

## 2018-06-30 ENCOUNTER — Other Ambulatory Visit (HOSPITAL_COMMUNITY)
Admission: RE | Admit: 2018-06-30 | Discharge: 2018-06-30 | Disposition: A | Discharge status: Home / Self Care | Payer: 59 | Source: Ambulatory Visit | Attending: Obstetrics & Gynecology | Admitting: Obstetrics & Gynecology

## 2018-06-30 ENCOUNTER — Encounter: Payer: Self-pay | Admitting: Obstetrics & Gynecology

## 2018-06-30 ENCOUNTER — Other Ambulatory Visit: Payer: Self-pay

## 2018-06-30 ENCOUNTER — Encounter: Payer: Self-pay | Admitting: Physician Assistant

## 2018-06-30 VITALS — BP 110/60 | HR 88 | Resp 16 | Ht 63.0 in | Wt 142.2 lb

## 2018-06-30 VITALS — BP 108/64 | HR 74 | Ht 63.0 in | Wt 142.2 lb

## 2018-06-30 DIAGNOSIS — Z124 Encounter for screening for malignant neoplasm of cervix: Secondary | ICD-10-CM | POA: Insufficient documentation

## 2018-06-30 DIAGNOSIS — R079 Chest pain, unspecified: Secondary | ICD-10-CM | POA: Diagnosis not present

## 2018-06-30 DIAGNOSIS — Z0000 Encounter for general adult medical examination without abnormal findings: Secondary | ICD-10-CM

## 2018-06-30 DIAGNOSIS — Z01419 Encounter for gynecological examination (general) (routine) without abnormal findings: Secondary | ICD-10-CM | POA: Diagnosis not present

## 2018-06-30 LAB — POCT URINALYSIS DIPSTICK
BILIRUBIN UA: NEGATIVE
Glucose, UA: NEGATIVE
KETONES UA: NEGATIVE
Leukocytes, UA: NEGATIVE
NITRITE UA: NEGATIVE
PH UA: 5 (ref 5.0–8.0)
PROTEIN UA: NEGATIVE
RBC UA: NEGATIVE
UROBILINOGEN UA: 0.2 U/dL

## 2018-06-30 MED ORDER — NORETHINDRONE 0.35 MG PO TABS: 1.0000 | ORAL_TABLET | Freq: Every day | ORAL | 4 refills | Status: DC

## 2018-06-30 NOTE — Progress Notes (Signed)
Cardiology Office Note    Date:  07/02/2018   ID:  Charlotte Velazquez, DOB 07-Aug-1966, MRN [id number]  PCP:  Patient, No Pcp Per  Cardiologist:  Dr. Mayford Knife  No chief complaint on file.   History of Present Illness:  Charlotte Velazquez is a 52 y.o. female with past medical history of palpitation and chest pain.  She has significant family history of CAD and her mother also has atrial fibrillation as well.  She is a former smoker.  She was seen in 2014 for evaluation of chest pain shortness of breath, Myoview showed no ischemia, echocardiogram also showed normal EF.  Patient was last seen by Dr. Mayford Knife in May 2017, she was also having chest pain at that time.  Her chest pain was felt to be atypical and are likely related to anxiety due to the loss of her family members.  ETT obtained on 01/21/2016 showed upsloping 1 mm ST depression noted during stress, good exercise tolerance with normal blood pressure response and no significant arrhythmia.  Overall low risk stress test without EKG finding suggest ischemia.  Since she also complained of palpitation at that time, a 30-day event monitor was obtained which came back normal except for one episode of sinus tachycardia with heart rate of 155.  Patient presents today for cardiology office evaluation for chest pain.  She describes a left-sided dull ache sensation that only last about 2 to 3 seconds each time.  This is not worse with palpation, body rotational deep inspiration.  It usually occurs at rest however patient also admits that she has not been exercising much.  Based on the blood work from earlier this year, it appears her LDL remain uncontrolled around 121.  Total cholesterol is also borderline elevated as well.  Triglyceride and HDL were good.  Given the atypical nature of her symptoms, we discussed the various options including plain old treadmill test versus coronary CT with FFR.  Eventually we recommended a plain old treadmill test as the initial  study in this case.  I also encouraged her to increase activity level as well to see if that will bring on the chest pain.  Otherwise she has no significant orthopnea or PND.  She has chronic lymphedema in bilateral lower extremity, this has not been changed in the past several years.   Past Medical History:  Diagnosis Date  . Allergic rhinitis    right leg swelling  . Back pain   . Back pain   . Chest pain   . Edema   . Heart burn   . Insomnia   . Racing heart beat     Past Surgical History:  Procedure Laterality Date  . BREAST SURGERY  10/2014   reduction done by Dr. Janae Sauce  . WISDOM TOOTH EXTRACTION      Current Medications: Outpatient Medications Prior to Visit  Medication Sig Dispense Refill  . ALPRAZolam (XANAX) 0.5 MG tablet as needed.     Mack Guise THYROID 60 MG tablet Take 1 tablet by mouth daily.  2  . Cholecalciferol (VITAMIN D PO) Take by mouth once a week.     . CYCLOBENZAPRINE HCL PO Take by mouth as needed.    . meloxicam (MOBIC) 7.5 MG tablet TAKE 1 TABLET TWICE DAILY AS NEEDED FOR PAIN. 60 tablet 7  . Menaquinone-7 (VITAMIN K2 PO) Take by mouth.    . NON FORMULARY kavinace ultra PM    . NON FORMULARY Neuro support tablet    .  norethindrone (MICRONOR,CAMILA,ERRIN) 0.35 MG tablet Take 1 tablet (0.35 mg total) by mouth daily. 3 Package 4  . Nutritional Supplements (DHEA PO) Take by mouth.    . ondansetron (ZOFRAN-ODT) 8 MG disintegrating tablet Take 8 mg by mouth 3 (three) times daily as needed. Nausea and vomiting  0  . Pregnenolone POWD by Does not apply route.    . RaNITidine HCl (ZANTAC PO) Take by mouth as needed.    . SUMAtriptan Succinate (IMITREX PO) Take by mouth as needed.    . temazepam (RESTORIL) 15 MG capsule     . triamterene-hydrochlorothiazide (MAXZIDE-25) 37.5-25 MG tablet     . vitamin B-12 (CYANOCOBALAMIN) 1000 MCG tablet Take by mouth.    . zolpidem (AMBIEN) 10 MG tablet TAKE 1 TABLET BY MOUTH EVERY DAY AT BEDTIME AS NEEDED 30 tablet 1     No facility-administered medications prior to visit.      Allergies:   Penicillins   Social History   Socioeconomic History  . Marital status: Married    Spouse name: Not on file  . Number of children: Not on file  . Years of education: Not on file  . Highest education level: Not on file  Occupational History  . Not on file  Social Needs  . Financial resource strain: Not on file  . Food insecurity:    Worry: Not on file    Inability: Not on file  . Transportation needs:    Medical: Not on file    Non-medical: Not on file  Tobacco Use  . Smoking status: Former Games developer  . Smokeless tobacco: Never Used  Substance and Sexual Activity  . Alcohol use: No  . Drug use: No  . Sexual activity: Yes    Birth control/protection: Surgical, Pill    Comment: vastectomy  Lifestyle  . Physical activity:    Days per week: Not on file    Minutes per session: Not on file  . Stress: Not on file  Relationships  . Social connections:    Talks on phone: Not on file    Gets together: Not on file    Attends religious service: Not on file    Active member of club or organization: Not on file    Attends meetings of clubs or organizations: Not on file    Relationship status: Not on file  Other Topics Concern  . Not on file  Social History Narrative  . Not on file     Family History:  The patient's family history includes Atrial fibrillation in her mother; Cancer in her other; Cancer (age of onset: 64) in her paternal aunt; Cancer (age of onset: 51) in her mother; Dementia in her mother; Heart attack in her father, maternal grandfather, and paternal grandfather; Kidney disease in her mother; Multiple sclerosis in her brother; Parkinson's disease in her mother.   ROS:   Please see the history of present illness.    ROS All other systems reviewed and are negative.   PHYSICAL EXAM:   VS:  BP 108/64   Pulse 74   Ht 5\' 3"  (1.6 m)   Wt 142 lb 3.2 oz (64.5 kg)   LMP 06/16/2018   SpO2 99%    BMI 25.19 kg/m    GEN: Well nourished, well developed, in no acute distress  HEENT: normal  Neck: no JVD, carotid bruits, or masses Cardiac: RRR; no murmurs, rubs, or gallops,no edema  Respiratory:  clear to auscultation bilaterally, normal work of breathing GI: soft, nontender,  nondistended, + BS MS: no deformity or atrophy  Skin: warm and dry, no rash Neuro:  Alert and Oriented x 3, Strength and sensation are intact Psych: euthymic mood, full affect  Wt Readings from Last 3 Encounters:  06/30/18 142 lb 3.2 oz (64.5 kg)  06/30/18 142 lb 3.2 oz (64.5 kg)  04/27/18 142 lb 12.8 oz (64.8 kg)      Studies/Labs Reviewed:   EKG:  EKG is ordered today.  The ekg ordered today demonstrates normal sinus rhythm, poor R wave progression anterior leads.  Overall low voltage.  Recent Labs: No results found for requested labs within last 8760 hours.   Lipid Panel No results found for: CHOL, TRIG, HDL, CHOLHDL, VLDL, LDLCALC, LDLDIRECT  Additional studies/ records that were reviewed today include:   ETT 01/21/2016 Study Highlights    Upsloping ST segment depression ST segment depression of 1 mm was noted during stress. This was not diagnostic of ischemia.  Good exercise tolerance, 9 minutes with normal blood pressure response and no adverse arrhythmias  Overall reassuring exercise treadmill test with no electrocardiographic evidence of ischemia.    Event monitor 01/21/2016 Study Highlights    Normal sinus rhythm, sinus bradycardia and sinus arrhythmia. The heart rate ranged from 50-155bpm.  Probable sinus tachycardia at 155bpm.       ASSESSMENT:    1. Chest pain, unspecified type      PLAN:  In order of problems listed above:  1. Chest pain: She has been having very atypical chest pain for the past 2 weeks.  It does not occur with exertion and only last about 2 to 3 seconds each.  I recommended plain old treadmill stress test.  We also discussed possibility of coronary  CT with FFR in the future if her symptom does recur.  She has significant family history of CAD.    Medication Adjustments/Labs and Tests Ordered: Current medicines are reviewed at length with the patient today.  Concerns regarding medicines are outlined above.  Medication changes, Labs and Tests ordered today are listed in the Patient Instructions below. Patient Instructions  Your physician recommends that you continue on your current medications as directed. Please refer to the Current Medication list given to you today.  Your physician has requested that you have an exercise tolerance test. For further information please visit [email or web address] Please also follow instruction sheet, as given.   Your physician recommends that you schedule a follow-up appointment in: 3 MONTHS WITH DR Shirl Harris, Azalee Course, [place]  07/02/2018 11:52 PM    [facility] [address] [place], [place], [place]  [address] Phone: [phone]; Fax: [phone]

## 2018-06-30 NOTE — Progress Notes (Signed)
52 y.o. G2P2 Married White or Caucasian female here for annual exam.  Doing well.  Reports she's at the end of a second pack of micronor.  Cycle in September and October were both 7 days.  Spotting/bleeding before the cycle was improved this last month.    H/o breast reduction with some lumpiness along her scar.  PCP:  [facility]    Patient's last menstrual period was 06/16/2018.          Sexually active: Yes.    The current method of family planning is vasectomy.    Exercising: No.   Smoker:  no  Health Maintenance: Pap:  03/03/17 Neg. History of abnormal Pap:  yes MMG: 03/08/16  [place] Left BIRADS2:Benign  Colonoscopy:  03/28/17 Polyp, Follow up 10 years.   BMD:  2015--unsure of exact date TDaP:  Unsure.  Declines updating today.    Pneumonia vaccine(s):  n/a Shingrix:   No Hep C testing: n/a Screening Labs: PCP   reports that she has quit smoking. She has never used smokeless tobacco. She reports that she does not drink alcohol or use drugs.  Past Medical History:  Diagnosis Date  . Allergic rhinitis    right leg swelling  . Back pain   . Back pain   . Chest pain   . Edema   . Heart burn   . Insomnia   . Racing heart beat     Past Surgical History:  Procedure Laterality Date  . BREAST SURGERY  10/2014   reduction done by Dr. Janae Sauce  . WISDOM TOOTH EXTRACTION      Current Outpatient Medications  Medication Sig Dispense Refill  . ALPRAZolam (XANAX) 0.5 MG tablet as needed.     Mack Guise THYROID 60 MG tablet Take 1 tablet by mouth daily.  2  . Cholecalciferol (VITAMIN D PO) Take by mouth once a week.     . CYCLOBENZAPRINE HCL PO Take by mouth as needed.    . meloxicam (MOBIC) 7.5 MG tablet TAKE 1 TABLET TWICE DAILY AS NEEDED FOR PAIN. 60 tablet 7  . Menaquinone-7 (VITAMIN K2 PO) Take by mouth.    . NON FORMULARY kavinace ultra PM    . NON FORMULARY Neuro support tablet    . norethindrone (MICRONOR,CAMILA,ERRIN) 0.35 MG tablet Take 1 tablet (0.35  mg total) by mouth daily. 1 Package 2  . Nutritional Supplements (DHEA PO) Take by mouth.    . ondansetron (ZOFRAN-ODT) 8 MG disintegrating tablet Take 8 mg by mouth 3 (three) times daily as needed. Nausea and vomiting  0  . Pregnenolone POWD by Does not apply route.    . RaNITidine HCl (ZANTAC PO) Take by mouth as needed.    . SUMAtriptan Succinate (IMITREX PO) Take by mouth as needed.    . temazepam (RESTORIL) 15 MG capsule     . triamterene-hydrochlorothiazide (MAXZIDE-25) 37.5-25 MG tablet     . vitamin B-12 (CYANOCOBALAMIN) 1000 MCG tablet Take by mouth.    . zolpidem (AMBIEN) 10 MG tablet TAKE 1 TABLET BY MOUTH EVERY DAY AT BEDTIME AS NEEDED 30 tablet 1   No current facility-administered medications for this visit.     Family History  Problem Relation Age of Onset  . Cancer Mother 40       breast  . Atrial fibrillation Mother   . Kidney disease Mother   . Parkinson's disease Mother   . Dementia Mother   . Heart attack Father   . Heart attack  Maternal Grandfather   . Heart attack Paternal Grandfather   . Cancer Paternal Aunt 27       ovarian  . Cancer Other        paternal great aunt (through [facility]) with ovarian cancer and several 1st cousins once removed (also through [facility]) with breast cancer  . Multiple sclerosis Brother     Review of Systems  Genitourinary: Positive for frequency.       Vaginal itching   All other systems reviewed and are negative.   Exam:   BP 110/60 (BP Location: Right Arm, Patient Position: Sitting, Cuff Size: Large)   Pulse 88   Resp 16   Ht 5\' 3"  (1.6 m)   Wt 142 lb 3.2 oz (64.5 kg)   LMP 06/16/2018   BMI 25.19 kg/m    Height: 5\' 3"  (160 cm)  Ht Readings from Last 3 Encounters:  06/30/18 5\' 3"  (1.6 m)  04/27/18 5' 3.5" (1.613 m)  04/26/17 5' 3.5" (1.613 m)    General appearance: alert, cooperative and appears stated age Head: Normocephalic, without obvious abnormality, atraumatic Neck: no adenopathy, supple, symmetrical, trachea  midline and thyroid normal to inspection and palpation Lungs: clear to auscultation bilaterally Breasts: normal appearance, no masses or tenderness, well healed scars present Heart: regular rate and rhythm Abdomen: soft, non-tender; bowel sounds normal; no masses,  no organomegaly Extremities: extremities normal, atraumatic, no cyanosis or edema Skin: Skin color, texture, turgor normal. No rashes or lesions Lymph nodes: Cervical, supraclavicular, and axillary nodes normal. No abnormal inguinal nodes palpated Neurologic: Grossly normal   Pelvic: External genitalia:  no lesions              Urethra:  normal appearing urethra with no masses, tenderness or lesions              Bartholins and Skenes: normal                 Vagina: normal appearing vagina with normal color and discharge, no lesions              Cervix: no lesions              Pap taken: Yes.   Bimanual Exam:  Uterus:  normal size, contour, position, consistency, mobility, non-tender              Adnexa: normal adnexa and no mass, fullness, tenderness               Rectovaginal: Confirms               Anus:  normal sphincter tone, no lesions  Chaperone was present for exam.  A:  Well Woman with normal exam H/O irregular bleeding before cycles begin, on POPs Vulvar itching Insomnia Increased risks of breast cancer.  Lifetime risks calculation 32.6% with TCM done today. Grade D breast density  P:   Mammogram is scheduled.  MRI discussed today.  She is consider breast ultrasound pap smear and HR HPV obtained today Does not need blood work Declines tdap.  Aware this is likely due. Shingrix vaccination given. Options for vaginal dryness reviewed.  Will try OTC products first. return annually or prn

## 2018-06-30 NOTE — Patient Instructions (Addendum)
Your physician recommends that you continue on your current medications as directed. Please refer to the Current Medication list given to you today.  Your physician has requested that you have an exercise tolerance test. For further information please visit [email or web address] Please also follow instruction sheet, as given.   Your physician recommends that you schedule a follow-up appointment in: 3 MONTHS WITH DR Mayford Knife

## 2018-07-02 ENCOUNTER — Encounter: Payer: Self-pay | Admitting: Physician Assistant

## 2018-07-04 LAB — CYTOLOGY - PAP
Diagnosis: NEGATIVE
HPV: NOT DETECTED

## 2018-07-05 ENCOUNTER — Telehealth (HOSPITAL_COMMUNITY): Payer: Self-pay

## 2018-07-05 NOTE — Telephone Encounter (Signed)
Encounter complete. 

## 2018-07-07 ENCOUNTER — Ambulatory Visit (HOSPITAL_COMMUNITY)
Admission: RE | Admit: 2018-07-07 | Discharge: 2018-07-07 | Disposition: A | Discharge status: Home / Self Care | Payer: 59 | Source: Ambulatory Visit | Attending: Cardiovascular Disease | Admitting: Cardiovascular Disease

## 2018-07-07 DIAGNOSIS — R079 Chest pain, unspecified: Secondary | ICD-10-CM

## 2018-07-07 LAB — EXERCISE TOLERANCE TEST
CHL CUP RESTING HR STRESS: 75 {beats}/min
CHL RATE OF PERCEIVED EXERTION: 16
CSEPED: 10 min
CSEPEW: 11.9 METS
Exercise duration (sec): 10 s
MPHR: 170 {beats}/min
Peak HR: 179 {beats}/min
Percent HR: 105 %

## 2018-07-10 ENCOUNTER — Other Ambulatory Visit: Payer: Self-pay | Admitting: Obstetrics & Gynecology

## 2018-07-10 NOTE — Telephone Encounter (Signed)
Medication refill request: ambien  Last AEX:  06/30/18 SM Next AEX: 11/06/19 SM  Last MMG (if hormonal medication request): 03/08/16  [place] Left BIRADS2:Benign Refill authorized: 03/20/18 #30/1R. Today please advise.

## 2018-07-12 ENCOUNTER — Other Ambulatory Visit: Payer: Self-pay | Admitting: Obstetrics & Gynecology

## 2018-10-12 ENCOUNTER — Ambulatory Visit: Payer: 59 | Admitting: Cardiology

## 2018-10-17 ENCOUNTER — Other Ambulatory Visit: Payer: Self-pay | Admitting: Obstetrics & Gynecology

## 2018-10-18 NOTE — Telephone Encounter (Signed)
Medication refill request: Zmbien  Last AEX:  06/30/18 Next AEX: 11/06/19 Last MMG (if hormonal medication request): 03/08/16 Bi-rads 2 benign Refill authorized: #30 with 1 RF

## 2018-10-25 DIAGNOSIS — R079 Chest pain, unspecified: Secondary | ICD-10-CM | POA: Insufficient documentation

## 2018-10-25 NOTE — Progress Notes (Signed)
Cardiology Office Note:    Date:  10/26/2018   ID:  Charlotte Velazquez, DOB April 29, 1966, MRN [id number]  PCP:  Patient, No Pcp Per  Cardiologist:  No primary care provider on file.    Referring MD: No ref. provider found   Chief Complaint  Patient presents with  . Chest Pain    History of Present Illness:    Charlotte Velazquez is a 53 y.o. female with a hx of strong family history of ASCAD and her mother has had atrial fibrillation.  She is a former smoker.  She was seen in Jan 12, 2013 with CP and SOB and nuclear stress test showed no ischemia and echo showed normal LVF.  When her mom had problems with afib she started having CP and SOB and was felt to be stress related.  Her mom and brother passed away in 13-Jan-2016 and prior to her Dad having his MI she was in the gym 4 days weekly doing hard impact aerobics without any problems and then after her Dad has his MI she started noticing her symptoms. ETT and echo and event monitor were normal at that time. It was felt that her sx were related to the stress of losing several family members and her Dad getting sick.    She was seen by the extender 06/2018 with complaints of left sided CP and was nonexertional.  Repeat ETT showed no ischemia.  She is here today for followup and is doing well.  She denies any further chest pain chest pain or pressure and no SOB, DOE, PND, orthopnea, LE edema, dizziness, palpitations or syncope. She is compliant with her meds and is tolerating meds with no SE. she is still very concerned that she may have underlying CAD.  She tells me that she does not understand why she would have chest pain if there was not something wrong with her heart.  Past Medical History:  Diagnosis Date  . Allergic rhinitis    right leg swelling  . Back pain   . Back pain   . Chest pain   . Edema   . Heart burn   . Insomnia   . Racing heart beat     Past Surgical History:  Procedure Laterality Date  . BREAST SURGERY  10/2014   reduction done by  Dr. Janae Sauce  . WISDOM TOOTH EXTRACTION      Current Medications: No outpatient medications have been marked as taking for the 10/26/18 encounter (Office Visit) with Quintella Reichert, MD.     Allergies:   Penicillins   Social History   Socioeconomic History  . Marital status: Married    Spouse name: Not on file  . Number of children: Not on file  . Years of education: Not on file  . Highest education level: Not on file  Occupational History  . Not on file  Social Needs  . Financial resource strain: Not on file  . Food insecurity:    Worry: Not on file    Inability: Not on file  . Transportation needs:    Medical: Not on file    Non-medical: Not on file  Tobacco Use  . Smoking status: Former Games developer  . Smokeless tobacco: Never Used  Substance and Sexual Activity  . Alcohol use: No  . Drug use: No  . Sexual activity: Yes    Birth control/protection: Surgical, Pill    Comment: vastectomy  Lifestyle  . Physical activity:    Days per week: Not on file  Minutes per session: Not on file  . Stress: Not on file  Relationships  . Social connections:    Talks on phone: Not on file    Gets together: Not on file    Attends religious service: Not on file    Active member of club or organization: Not on file    Attends meetings of clubs or organizations: Not on file    Relationship status: Not on file  Other Topics Concern  . Not on file  Social History Narrative  . Not on file     Family History: The patient's family history includes Atrial fibrillation in her mother; Cancer in an other family member; Cancer (age of onset: 45) in her paternal aunt; Cancer (age of onset: 89) in her mother; Dementia in her mother; Heart attack in her father, maternal grandfather, and paternal grandfather; Kidney disease in her mother; Multiple sclerosis in her brother; Parkinson's disease in her mother.  ROS:   Please see the history of present illness.    ROS  All other systems  reviewed and negative.   EKGs/Labs/Other Studies Reviewed:    The following studies were reviewed today: none  EKG:  EKG is not ordered today.   Recent Labs: No results found for requested labs within last 8760 hours.   Recent Lipid Panel No results found for: CHOL, TRIG, HDL, CHOLHDL, VLDL, LDLCALC, LDLDIRECT  Physical Exam:    VS:  There were no vitals taken for this visit.    Wt Readings from Last 3 Encounters:  06/30/18 142 lb 3.2 oz (64.5 kg)  06/30/18 142 lb 3.2 oz (64.5 kg)  04/27/18 142 lb 12.8 oz (64.8 kg)     GEN:  Well nourished, well developed in no acute distress HEENT: Normal NECK: No JVD; No carotid bruits LYMPHATICS: No lymphadenopathy CARDIAC: RRR, no murmurs, rubs, gallops RESPIRATORY:  Clear to auscultation without rales, wheezing or rhonchi  ABDOMEN: Soft, non-tender, non-distended MUSCULOSKELETAL:  No edema; No deformity  SKIN: Warm and dry NEUROLOGIC:  Alert and oriented x 3 PSYCHIATRIC:  Normal affect   ASSESSMENT:    1. Chest pain, unspecified type    PLAN:    In order of problems listed above:  1.  Chest pain - recent ETT showed no ischemia and her sx were atypical and nonexertional.  I think she has a lot of stress and anxiety surrounding her mom and brother's deaths and her Fathers dx of CAD.  She is very fixated on the possibility of underlying CAD.  She told me that she recently had carotid Dopplers done at 1 of those screening facilities and was told she had some plaque buildup in her carotid arteries but minimal.  I have recommended that we get a coronary CTA with morphology and FFR to define coronary anatomy.  I will see her back on a as needed basis if this is normal.   Medication Adjustments/Labs and Tests Ordered: Current medicines are reviewed at length with the patient today.  Concerns regarding medicines are outlined above.  No orders of the defined types were placed in this encounter.  No orders of the defined types were  placed in this encounter.   Signed, Armanda Magic, MD  10/26/2018 9:34 AM    [facility]

## 2018-10-26 ENCOUNTER — Encounter: Payer: Self-pay | Admitting: Cardiology

## 2018-10-26 ENCOUNTER — Ambulatory Visit (INDEPENDENT_AMBULATORY_CARE_PROVIDER_SITE_OTHER): Payer: 59 | Admitting: Cardiology

## 2018-10-26 DIAGNOSIS — R079 Chest pain, unspecified: Secondary | ICD-10-CM

## 2018-10-26 MED ORDER — METOPROLOL TARTRATE 100 MG PO TABS: ORAL_TABLET | ORAL | 3 refills | Status: DC

## 2018-10-26 NOTE — Patient Instructions (Addendum)
Medication Instructions:  Your physician recommends that you continue on your current medications as directed. Please refer to the Current Medication list given to you today.  If you need a refill on your cardiac medications before your next appointment, please call your pharmacy.   Lab work: Future: BMET, 1 week before Cardiac CT.  If you have labs (blood work) drawn today and your tests are completely normal, you will receive your results only by: Marland Kitchen MyChart Message (if you have MyChart) OR . A paper copy in the mail If you have any lab test that is abnormal or we need to change your treatment, we will call you to review the results.  Testing/Procedures: Your physician has requested that you have cardiac CT. Cardiac computed tomography (CT) is a painless test that uses an x-ray machine to take clear, detailed pictures of your heart. For further information please visit [email or web address] Please follow instruction sheet as given.  Follow-Up: As needed.  Please arrive at the [facility] main entrance of [facility] at xx:xx AM (30-45 minutes prior to test start time)  [facility] [address] [place], [place] [address] [phone]  Proceed to the [facility] Radiology Department (First Floor).  Please follow these instructions carefully (unless otherwise directed):  On the Night Before the Test: . Be sure to Drink plenty of water. . Do not consume any caffeinated/decaffeinated beverages or chocolate 12 hours prior to your test. . Do not take any antihistamines 12 hours prior to your test.  On the Day of the Test: . Drink plenty of water. Do not drink any water within one hour of the test. . Do not eat any food 4 hours prior to the test. . You may take your regular medications prior to the test.  . Take metoprolol (Lopressor) two hours prior to test. . HOLD Hydrochlorothiazide ( Maxzide) morning of the test.  After the Test: . Drink plenty of  water. . After receiving IV contrast, you may experience a mild flushed feeling. This is normal. . On occasion, you may experience a mild rash up to 24 hours after the test. This is not dangerous. If this occurs, you can take Benadryl 25 mg and increase your fluid intake. . If you experience trouble breathing, this can be serious. If it is severe call 911 IMMEDIATELY. If it is mild, please call our office. . If you take any of these medications: Glipizide/Metformin, Avandament, Glucavance, please do not take 48 hours after completing test.

## 2019-01-05 ENCOUNTER — Other Ambulatory Visit (INDEPENDENT_AMBULATORY_CARE_PROVIDER_SITE_OTHER): Payer: Self-pay | Admitting: Orthopaedic Surgery

## 2019-01-05 NOTE — Telephone Encounter (Signed)
Ok to refill, but after one year of being on this, she needs to give her body a medication break from nsaids

## 2019-01-17 ENCOUNTER — Telehealth: Payer: Self-pay | Admitting: Cardiology

## 2019-01-17 NOTE — Telephone Encounter (Signed)
Left message to call and schedule cardiac ct  °

## 2019-02-12 ENCOUNTER — Encounter: Payer: Self-pay | Admitting: Cardiology

## 2019-02-13 ENCOUNTER — Other Ambulatory Visit: Payer: Self-pay | Admitting: *Deleted

## 2019-02-13 MED ORDER — ZOLPIDEM TARTRATE 10 MG PO TABS: 10.0000 mg | ORAL_TABLET | Freq: Every evening | ORAL | 1 refills | Status: DC | PRN

## 2019-02-13 NOTE — Telephone Encounter (Signed)
Medication refill request: Zolpidem  Last AEX:  06-30-18 SM  Next AEX: 11-06-19  Last MMG (if hormonal medication request): n/a Refill authorized: 10-18-18 #30, 1 RF.   Please refill if appropriate.

## 2019-05-15 ENCOUNTER — Other Ambulatory Visit: Payer: Self-pay | Admitting: Obstetrics & Gynecology

## 2019-05-16 NOTE — Telephone Encounter (Signed)
Medication refill request: Ambien  Last AEX:  06/30/18 Next AEX: 11/06/19 Last MMG (if hormonal medication request): na Refill authorized:#30 with 1 rf please refill is appropriate.

## 2019-05-23 ENCOUNTER — Encounter: Payer: Self-pay | Admitting: Obstetrics & Gynecology

## 2019-08-15 ENCOUNTER — Telehealth: Payer: Self-pay | Admitting: *Deleted

## 2019-08-15 ENCOUNTER — Encounter: Payer: Self-pay | Admitting: Obstetrics & Gynecology

## 2019-08-15 NOTE — Telephone Encounter (Signed)
Left message to call Sharee Pimple, RN at [facility].   Call to patient to confirm Sertraline dosage. Move up AEX?    Per review of Epic and Care Everywhere no ABO/Rh on file.  Last AEX 06/30/18 Next AEX 11/06/19

## 2019-08-15 NOTE — Telephone Encounter (Signed)
Zadie Cleverly "Debbie"  P [facility] Clinical Pool  Phone Number: [phone]        I have 2 questions.Marland KitchenMarland Kitchen   1) Do you have my blood type on file?   2) When I was still seeing Juanda Chance, she prescribed Sertraline during a period of anxiety and depression I was experiencing. I took the pills for a brief period and came off of them as quickly as I could. Thanks to all the joys that COVID seems to have brought into my life, I began taking the Sertraline pills that I already had from the previous prescription. I've been on them for about two months now and they seem to be helping tremendously. I haven't experienced any adverse side effects. I've got about a months supply left and will run out before my next visit with you on March 2nd. Would you be able to refill this prescription for me?   Thanks for your time and hope you are staying safe and healthy.   Valrie Hart

## 2019-08-15 NOTE — Telephone Encounter (Signed)
Patient returned call

## 2019-08-15 NOTE — Telephone Encounter (Signed)
Spoke with patient, advised no blood type on file. Patient is going to check with other providers. Confirmed patient is taking sertraline 50 mg daily, has 30 day supply remaining. Recommended moving AEX to earlier date, patient agreeable.   AEX r/s to 12/31 at 8:30am with Dr. Sabra Heck. Will discuss Rx at AEX. Patient verbalizes understanding.   Routing to provider for final review. Patient is agreeable to disposition. Will close encounter.

## 2019-09-03 ENCOUNTER — Other Ambulatory Visit: Payer: Self-pay | Admitting: Obstetrics & Gynecology

## 2019-09-04 NOTE — Progress Notes (Signed)
53 y.o. G2P2 Married White or Caucasian female here for annual exam.  Has a new grandson born October 20th.  Daughter is home from college.  This made the holiday really lovely.    Being followed by Dr. Radford Pax.  Did have exercise stress treadmill that was fine.  Coronary CT was recommended but she has not done this right now.  She is not having any symptoms.  She is still considering this but deductible is really high.    Decided last year not to proceed with breast MRI either due to cost.  Has increased lifetime risk of breast cancer with [employer] model.    She is still having menstrual cycles.  These are more like 40-45 days apart.  Flow is not heavy.    Last LMP 08/15/2019          Sexually active: Yes.    The current method of family planning is vasectomy.    Exercising: No.  The patient does not participate in regular exercise at present. Smoker:  no  Health Maintenance: Pap:  Neg pap and neg HR HPV 06/30/2018 History of abnormal Pap:  yes MMG:  05/23/19 BIRADS 2 benign/density d Colonoscopy:  03/28/17 Polyp, Follow up 10 years BMD:   2015 not sure of date TDaP:  Unsure Pneumonia vaccine(s):  no Shingrix:   reviewed Hep C testing: no Screening Labs: PCP   reports that she has quit smoking. She has never used smokeless tobacco. She reports that she does not drink alcohol or use drugs.  Past Medical History:  Diagnosis Date  . Allergic rhinitis    right leg swelling  . Back pain   . Back pain   . Chest pain   . Edema   . Heart burn   . Insomnia   . Racing heart beat     Past Surgical History:  Procedure Laterality Date  . BREAST SURGERY  10/2014   reduction done by Dr. Emeline Darling  . WISDOM TOOTH EXTRACTION      Current Outpatient Medications  Medication Sig Dispense Refill  . ALPRAZolam (XANAX) 0.5 MG tablet as needed.     Francia Greaves THYROID 60 MG tablet Take 1 tablet by mouth daily.  2  . Cholecalciferol (VITAMIN D PO) Take by mouth once a week.     .  CYCLOBENZAPRINE HCL PO Take by mouth as needed.    . meloxicam (MOBIC) 7.5 MG tablet TAKE 1 TABLET BY MOUTH TWICE DAILY AS NEEDED FOR PAIN 60 tablet 7  . Menaquinone-7 (VITAMIN K2 PO) Take by mouth.    . NON FORMULARY kavinace ultra PM    . NON FORMULARY Neuro support tablet    . norethindrone (MICRONOR,CAMILA,ERRIN) 0.35 MG tablet Take 1 tablet (0.35 mg total) by mouth daily. 3 Package 4  . Nutritional Supplements (DHEA PO) Take by mouth.    . ondansetron (ZOFRAN-ODT) 8 MG disintegrating tablet Take 8 mg by mouth 3 (three) times daily as needed. Nausea and vomiting  0  . Pregnenolone POWD by Does not apply route.    . SUMAtriptan Succinate (IMITREX PO) Take by mouth as needed.    . temazepam (RESTORIL) 15 MG capsule     . triamterene-hydrochlorothiazide (MAXZIDE-25) 37.5-25 MG tablet     . vitamin B-12 (CYANOCOBALAMIN) 1000 MCG tablet Take by mouth.    . zolpidem (AMBIEN) 5 MG tablet Take 1 tablet (5 mg total) by mouth at bedtime as needed for sleep. 30 tablet 1   No current  facility-administered medications for this visit.    Family History  Problem Relation Age of Onset  . Cancer Mother 74       breast  . Atrial fibrillation Mother   . Kidney disease Mother   . Parkinson's disease Mother   . Dementia Mother   . Heart attack Father   . Heart attack Maternal Grandfather   . Heart attack Paternal Grandfather   . Cancer Paternal Aunt 57       ovarian  . Cancer Other        paternal great aunt (through [facility]) with ovarian cancer and several 1st cousins once removed (also through [facility]) with breast cancer  . Multiple sclerosis Brother     Review of Systems  All other systems reviewed and are negative.   Exam:   BP 118/64   Pulse 78   Temp 97.8 F (36.6 C)   Ht 5' 2.5" (1.588 m)   Wt 135 lb (61.2 kg)   LMP 08/15/2019 (Exact Date)   SpO2 96%   BMI 24.30 kg/m    Height: 5' 2.5" (158.8 cm)  Ht Readings from Last 3 Encounters:  09/06/19 5' 2.5" (1.588 m)  10/26/18 5\' 3"  (1.6  m)  06/30/18 5\' 3"  (1.6 m)    General appearance: alert, cooperative and appears stated age Head: Normocephalic, without obvious abnormality, atraumatic Neck: no adenopathy, supple, symmetrical, trachea midline and thyroid normal to inspection and palpation Lungs: clear to auscultation bilaterally Breasts: normal appearance, no masses or tenderness Heart: regular rate and rhythm Abdomen: soft, non-tender; bowel sounds normal; no masses,  no organomegaly Extremities: extremities normal, atraumatic, no cyanosis or edema Skin: Skin color, texture, turgor normal. No rashes or lesions Lymph nodes: Cervical, supraclavicular, and axillary nodes normal. No abnormal inguinal nodes palpated Neurologic: Grossly normal   Pelvic: External genitalia:  no lesions              Urethra:  normal appearing urethra with no masses, tenderness or lesions              Bartholins and Skenes: normal                 Vagina: normal appearing vagina with normal color and discharge, no lesions              Cervix: no lesions              Pap taken: No. Bimanual Exam:  Uterus:  normal size, contour, position, consistency, mobility, non-tender              Adnexa: normal adnexa and no mass, fullness, tenderness               Rectovaginal: Confirms               Anus:  normal sphincter tone, no lesions  Chaperone, 07/02/18, CMA, was present for exam.  A:  Well Woman with normal exam Perimenopausal H/O migraines H/o insomnia, uses ambien Increased risks of breast cancer.  Lifetime risk calculation 32.6%  P:   Mammogram guidelines reviewed.  Doing 3D.  Declines breast MRI at this time. pap smear with neg HR HPV 2019.   Imitrex 200mg  po x 1, repeat in 2 hours is needed.  #11/5RF. Cyclobenzaprine rx needed.  Pt does not know dosage and will need to contact me with this. Has Ambien 5mg  RF #30/1RF.  Uses Temazepam rarely and not with Ambien.  #30/0RF.   Does blood work with Zenovia Jordan.  Will  send me copies of lab work. Tdap and shingrix vaccination discussed.  Will, at some point, need to update Tdap as I do not know when her last one was and neither does she.  Aware she needs update if has any exposure. Return annually or prn

## 2019-09-04 NOTE — Telephone Encounter (Signed)
Please let pt know her rx was completed.  Ambien is now recommended at a 5mg  dosage for women so I did change the dosage from 10mg  to 5mg .  Just wanted her to know why this was different.

## 2019-09-04 NOTE — Telephone Encounter (Signed)
Medication refill request: Ambien Last AEX:  06/30/18 SM Next AEX: 12//31/20 Last MMG (if hormonal medication request): 05/23/19 BIRADS 2 benign/density d Refill authorized: please advise; Order pended #30 w/1 if appropriate.

## 2019-09-05 NOTE — Telephone Encounter (Signed)
Left detailed message per DPR re: Rx Ambien new dosage. Pt to call back to office if have any questions.   Will route to Dr Sabra Heck for review and will close encounter

## 2019-09-06 ENCOUNTER — Other Ambulatory Visit: Payer: Self-pay | Admitting: Obstetrics & Gynecology

## 2019-09-06 ENCOUNTER — Encounter: Payer: Self-pay | Admitting: Obstetrics & Gynecology

## 2019-09-06 ENCOUNTER — Ambulatory Visit (INDEPENDENT_AMBULATORY_CARE_PROVIDER_SITE_OTHER): Payer: 59 | Admitting: Obstetrics & Gynecology

## 2019-09-06 ENCOUNTER — Other Ambulatory Visit: Payer: Self-pay

## 2019-09-06 VITALS — BP 118/64 | HR 78 | Temp 97.8°F | Ht 62.5 in | Wt 135.0 lb

## 2019-09-06 DIAGNOSIS — Z01419 Encounter for gynecological examination (general) (routine) without abnormal findings: Secondary | ICD-10-CM | POA: Diagnosis not present

## 2019-09-06 MED ORDER — NORETHINDRONE 0.35 MG PO TABS: 1.0000 | ORAL_TABLET | Freq: Every day | ORAL | 4 refills | Status: DC

## 2019-09-06 MED ORDER — TEMAZEPAM 15 MG PO CAPS: 15.0000 mg | ORAL_CAPSULE | Freq: Every evening | ORAL | 0 refills | Status: DC | PRN

## 2019-09-06 MED ORDER — SUMATRIPTAN SUCCINATE 100 MG PO TABS: ORAL_TABLET | ORAL | 5 refills | Status: DC

## 2019-09-06 NOTE — Telephone Encounter (Signed)
Pt sent mychart message after AEX appt today. Pt wanting refill on Zofran. Orders pended if approved. #20, 0RF.   Will route to Dr Sabra Heck for review and Rx refill.

## 2019-09-06 NOTE — Telephone Encounter (Signed)
Patient sent the following message through [facility]. Routing to triage to assist patient with request.  Charlotte Velazquez "Charlotte Velazquez"  [facility]  Phone Number: [phone]  Cyclobenzaprine 10mg   Sertraline 50mg    Also, forgot to ask about a refill for Zofran. Can you please refill that one for me?  I'm ok for now on Xanax, but will reach out if I find that I need it.   Labs should be attached for the last 2 years. Will update results once I have those done in the next couple weeks.   Thank you,  [name]

## 2019-09-11 MED ORDER — ONDANSETRON 8 MG PO TBDP: 8.0000 mg | ORAL_TABLET | Freq: Three times a day (TID) | ORAL | 0 refills | Status: DC | PRN

## 2019-09-13 ENCOUNTER — Other Ambulatory Visit: Payer: Self-pay | Admitting: *Deleted

## 2019-09-13 MED ORDER — CYCLOBENZAPRINE HCL 10 MG PO TABS: 10.0000 mg | ORAL_TABLET | Freq: Three times a day (TID) | ORAL | 0 refills | Status: DC | PRN

## 2019-09-13 NOTE — Telephone Encounter (Signed)
Medication refill request: flexeril  Last AEX:  09-06-2019 SM  Next AEX: 11-14-20 Last MMG (if hormonal medication request): 05-23-2019 BIRADS 2 benign  Refill authorized: Today, please advise.   Message from pharmacy: "Patient requests new rx. Please send for flexeril 10mg  generic."  Medication pended for #30, 0RF. Please refill if appropriate.

## 2019-09-29 ENCOUNTER — Other Ambulatory Visit: Payer: Self-pay | Admitting: Obstetrics & Gynecology

## 2019-10-01 NOTE — Telephone Encounter (Signed)
Medication refill request: Zoloft Last AEX:  09/06/19  Next AEX: 11/14/20 Last MMG (if hormonal medication request): NA Refill authorized: #90 with 1 RF pended

## 2019-10-20 ENCOUNTER — Other Ambulatory Visit (INDEPENDENT_AMBULATORY_CARE_PROVIDER_SITE_OTHER): Payer: Self-pay | Admitting: Physician Assistant

## 2019-11-06 ENCOUNTER — Ambulatory Visit: Payer: 59 | Admitting: Obstetrics & Gynecology

## 2019-11-17 ENCOUNTER — Other Ambulatory Visit: Payer: Self-pay | Admitting: Obstetrics & Gynecology

## 2019-11-19 NOTE — Telephone Encounter (Signed)
Medication refill request: Ambien  Last AEX:  09/06/19  Next AEX: 11/14/20 Last MMG (if hormonal medication request): 05/23/19 bi-rads 2 benign  Refill authorized: #30 1RF

## 2019-12-16 ENCOUNTER — Other Ambulatory Visit: Payer: Self-pay | Admitting: Obstetrics & Gynecology

## 2019-12-17 NOTE — Telephone Encounter (Signed)
Medication refill request: temazepam Last AEX:  09-06-2019 SM  Next AEX: 11-14-20 Last MMG (if hormonal medication request): n/a Refill authorized: Today, please advise.   Medication pended for #30, 0RF. Please refill if appropriate.

## 2020-02-02 ENCOUNTER — Other Ambulatory Visit: Payer: Self-pay | Admitting: Obstetrics & Gynecology

## 2020-02-05 NOTE — Telephone Encounter (Signed)
Medication refill request: Ambien Last AEX:  09/06/19 SM Next AEX: 11/14/20 Last MMG (if hormonal medication request): n/a Refill authorized: Please advise on refill; order pended #30 w/1 refills if authorized

## 2020-02-25 ENCOUNTER — Other Ambulatory Visit: Payer: Self-pay | Admitting: Obstetrics & Gynecology

## 2020-02-25 NOTE — Telephone Encounter (Signed)
Medication refill request: restoril  Last AEX: 09-06-2019 SM  Next AEX: 11-14-20  Last MMG (if hormonal medication request): n/a Refill authorized: Today, please advise.   Medication pended for #30, 0RF. Please refill if appropriate.

## 2020-02-27 NOTE — Telephone Encounter (Signed)
Could you please contact pt and see if she needs this.  She uses it very rarely and I did rx for #30 in December.  Rx denied for now.  Thanks.

## 2020-02-28 NOTE — Telephone Encounter (Signed)
Message left to return call to Roemello Speyer at [phone].    

## 2020-03-01 ENCOUNTER — Other Ambulatory Visit: Payer: Self-pay | Admitting: Obstetrics & Gynecology

## 2020-03-03 NOTE — Telephone Encounter (Signed)
Medication refill request: Temazepam 15mg  #30, R0 Last AEX:  09-06-19 Next AEX: 11-14-20 Last MMG (if hormonal medication request): n/a Refill authorized: Please refill if appropriate--this is a patient of Dr.Miller's

## 2020-03-03 NOTE — Telephone Encounter (Signed)
Can you please call and check on this patient, she just got a script for Ambien on 02/05/20

## 2020-03-04 NOTE — Telephone Encounter (Signed)
Left message to call Javae Braaten at [phone]. 

## 2020-03-05 NOTE — Telephone Encounter (Signed)
Patient left message returning call.

## 2020-03-06 NOTE — Telephone Encounter (Signed)
Spoke with patient. Patient states that she never takes Ambien and Temazepam in the same day. Reports she only takes them as needed. Takes Ambien on nights when she is able to get to sleep, but wakes up and is not able to fall back to sleep. Takes Temazepam if she is very anxious before bed and is unable to fall asleep at all. Reports that she keeps both prescriptions separated so that she does not risk mixing them up or taking together. Advised will review with MD. Pharmacy on file is correct.

## 2020-03-06 NOTE — Telephone Encounter (Signed)
Call to patient. Per DPR, can leave message on voice mail which has number confirmation.  Left message that will need to review refill request with Dr Hyacinth Meeker when she returns to office on Tuesday.

## 2020-03-12 ENCOUNTER — Other Ambulatory Visit: Payer: Self-pay | Admitting: Obstetrics & Gynecology

## 2020-03-12 MED ORDER — TEMAZEPAM 15 MG PO CAPS: ORAL_CAPSULE | ORAL | 0 refills | Status: DC

## 2020-03-13 NOTE — Telephone Encounter (Signed)
See refill encounter dated 03-01-20.

## 2020-04-14 ENCOUNTER — Telehealth: Payer: Self-pay | Admitting: Cardiology

## 2020-04-14 NOTE — Telephone Encounter (Signed)
I am fine with switch.  Hank I ordered a coronary CTA over a year ago that she cancelled.  Please read my last OV note

## 2020-04-14 NOTE — Telephone Encounter (Signed)
Patient would like to switch from Dr. Mayford Knife to Dr. Katrinka Blazing are you both in agreement with this? Please advise.

## 2020-04-15 NOTE — Telephone Encounter (Signed)
Okay. Thanks Traci. I looked at the note and think Cor CTA is very reasonable.

## 2020-04-21 NOTE — Telephone Encounter (Signed)
Patient is calling to follow up regarding provider swap from Dr. Mayford Knife to Dr. Katrinka Blazing. Please advise.

## 2020-04-22 NOTE — Telephone Encounter (Signed)
I said okay below.

## 2020-05-25 NOTE — Progress Notes (Addendum)
Cardiology Office Note:    Date:  05/26/2020   ID:  [name], DOB [birth year], MRN [id number]  PCP:  Patient, No Pcp Per  Cardiologist:  No primary care provider on file.   Referring MD: No ref. provider found   Chief Complaint  Patient presents with  . Coronary Artery Disease    History of Present Illness:    Charlotte Velazquez is a 54 y.o. female with a hx of CAD (father MI), mom AF, prior smoker, severe hyperlipidemia, and h/o chest pain with palpitations. Prior cardiac w/u includes monitor 2019, ETT 2019, echo 2014. Cor CTA with FFR recommended but not performed in 2020.  She is here because of concerns about elevated risk for developing ischemic heart disease and vascular disease in other territories because of family history of MI, hyperlipidemia, and mother's personal history of atrial fibrillation.  She has local parasternal atypical chest pain episodes.  These have been intermittent over the years.  Episodes are at rest and not with physical activity.  She has had negative ischemic work-up as recently as 2019.  She has had negative cardiac monitors.  2D Doppler echocardiogram has demonstrated structurally normal heart as recently as 2014.  When last seen by Dr. Mayford Knife, coronary CT with FFR was recommended.  She dropped on the testing because of expense.  She wonders if she should have a coronary calcium score.  She has had carotid Dopplers done before, information not available.  Has plaque in her left carotid.  Past Medical History:  Diagnosis Date  . Allergic rhinitis    right leg swelling  . Back pain   . Back pain   . Chest pain   . Edema   . Heart burn   . Insomnia   . Racing heart beat     Past Surgical History:  Procedure Laterality Date  . BREAST SURGERY  10/2014   reduction done by Dr. Janae Sauce  . WISDOM TOOTH EXTRACTION      Current Medications: Current Meds  Medication Sig  . ALPRAZolam (XANAX) 0.5 MG tablet as needed.   Mack Guise THYROID  60 MG tablet Take 1 tablet by mouth daily.  . Cholecalciferol (VITAMIN D PO) Take 10,000 Units by mouth daily.   . cyclobenzaprine (FLEXERIL) 10 MG tablet Take 1 tablet (10 mg total) by mouth every 8 (eight) hours as needed.  . meloxicam (MOBIC) 7.5 MG tablet TAKE 1 TABLET BY MOUTH TWICE DAILY AS NEEDED FOR PAIN  . Menaquinone-7 (VITAMIN K2 PO) Take by mouth.  . NON FORMULARY Neuro support tablet  . Nutritional Supplements (DHEA PO) Take by mouth.  . ondansetron (ZOFRAN-ODT) 8 MG disintegrating tablet Take 1 tablet (8 mg total) by mouth 3 (three) times daily as needed. Nausea and vomiting  . Pregnenolone POWD by Does not apply route.  . SUMAtriptan (IMITREX) 100 MG tablet 1 po x 1, repeat in 2 hours as needed.  Max 200mg /24 hours.  . temazepam (RESTORIL) 15 MG capsule TAKE 1 CAPSULE BY MOUTH AT BEDTIME AS NEEDED FOR SLEEP  . triamterene-hydrochlorothiazide (MAXZIDE-25) 37.5-25 MG tablet   . vitamin B-12 (CYANOCOBALAMIN) 1000 MCG tablet Take by mouth.  . zolpidem (AMBIEN) 5 MG tablet TAKE 1 TABLET (5 MG TOTAL) BY MOUTH AT BEDTIME AS NEEDED FOR SLEEP.     Allergies:   Penicillins   Social History   Socioeconomic History  . Marital status: Married    Spouse name: Not on file  . Number of children: Not on file  .  Years of education: Not on file  . Highest education level: Not on file  Occupational History  . Not on file  Tobacco Use  . Smoking status: Former Games developer  . Smokeless tobacco: Never Used  Vaping Use  . Vaping Use: Never used  Substance and Sexual Activity  . Alcohol use: No  . Drug use: No  . Sexual activity: Yes    Birth control/protection: Surgical, Pill    Comment: vastectomy  Other Topics Concern  . Not on file  Social History Narrative  . Not on file   Social Determinants of Health   Financial Resource Strain:   . Difficulty of Paying Living Expenses: Not on file  Food Insecurity:   . Worried About Programme researcher, broadcasting/film/video in the Last Year: Not on file  . Ran  Out of Food in the Last Year: Not on file  Transportation Needs:   . Lack of Transportation (Medical): Not on file  . Lack of Transportation (Non-Medical): Not on file  Physical Activity:   . Days of Exercise per Week: Not on file  . Minutes of Exercise per Session: Not on file  Stress:   . Feeling of Stress : Not on file  Social Connections:   . Frequency of Communication with Friends and Family: Not on file  . Frequency of Social Gatherings with Friends and Family: Not on file  . Attends Religious Services: Not on file  . Active Member of Clubs or Organizations: Not on file  . Attends Banker Meetings: Not on file  . Marital Status: Not on file     Family History: The patient's family history includes Atrial fibrillation in her mother; Cancer in an other family member; Cancer (age of onset: 80) in her paternal aunt; Cancer (age of onset: 29) in her mother; Dementia in her mother; Heart attack in her father, maternal grandfather, and paternal grandfather; Kidney disease in her mother; Multiple sclerosis in her brother; Parkinson's disease in her mother.  ROS:   Please see the history of present illness.    Vaccinated and practicing social medication.  No cardiac complaints.  She wishes to be more proactive relative to risk reduction.  She has right lower extremity lymphedema that was diagnosed at age 47.  This gives her trouble intermittently if it swells too much..  All other systems reviewed and are negative.  EKGs/Labs/Other Studies Reviewed:    The following studies were reviewed today:  I reviewed the lipid NMR from 2018: LDL particle # [id number]; particle size was large (21.5 ); overall study suggested low to moderate CV risk.  EKG:  EKG normal tracing, left axis deviation, poor R wave progression.  Recent Labs: No results found for requested labs within last 8760 hours.  Recent Lipid Panel No results found for: CHOL, TRIG, HDL, CHOLHDL, VLDL, LDLCALC,  LDLDIRECT  Physical Exam:    VS:  BP 102/62   Pulse 65   Ht 5' 2.5" (1.588 m)   Wt 145 lb 3.2 oz (65.9 kg)   SpO2 99%   BMI 26.13 kg/m     Wt Readings from Last 3 Encounters:  05/26/20 145 lb 3.2 oz (65.9 kg)  09/06/19 135 lb (61.2 kg)  10/26/18 153 lb 12.8 oz (69.8 kg)     GEN: Overweight. No acute distress HEENT: Normal NECK: No JVD. LYMPHATICS: No lymphadenopathy CARDIAC:  RRR without murmur, gallop, or edema. VASCULAR:  Normal Pulses. No bruits. RESPIRATORY:  Clear to auscultation without rales, wheezing or  rhonchi  ABDOMEN: Soft, non-tender, non-distended, No pulsatile mass, MUSCULOSKELETAL: No deformity  SKIN: Warm and dry NEUROLOGIC:  Alert and oriented x 3 PSYCHIATRIC:  Normal affect   ASSESSMENT:    1. Chest pain, unspecified type   2. Family history of early CAD   3. Hyperlipidemia LDL goal <70   4. Discontinued smoking   5. Educated about COVID-19 virus infection    PLAN:    In order of problems listed above:  1. The chest pain is atypical and not representative of ischemic pain.  I do not believe further work-up is necessary given the chronicity of the complaint and absence of typical anginal features.  I do recognize that she is a female but objective evaluation for this pain has never identified any concerning features/findings. 2. Noted that her father had myocardial infarction but he was in his 82s. 3. Her lipids are extremely elevated.  I reviewed all records.  Lipid NMR in 2018 suggested low to moderate CV risk.  Coronary calcium score will be done.  Despite this nonatherogenic NMR, carotid plaque is present, and we need to treat LDL to target of 70 or less.  She had a cup of coffee this morning without cream.  We will do a lipid panel today. 4. Discontinued smoking greater than 30 years ago. 5. She is vaccinated and practicing medication.  Overall education and awareness concerning primary/secondary risk prevention was discussed in detail: LDL less  than 70, hemoglobin A1c less than 7, blood pressure target less than 130/80 mmHg, >150 minutes of moderate aerobic activity per week, avoidance of smoking, weight control (via diet and exercise), and continued surveillance/management of/for obstructive sleep apnea.    Medication Adjustments/Labs and Tests Ordered: Current medicines are reviewed at length with the patient today.  Concerns regarding medicines are outlined above.  Orders Placed This Encounter  Procedures  . EKG 12-Lead   No orders of the defined types were placed in this encounter.   There are no Patient Instructions on file for this visit.   Signed, Lesleigh Noe, MD  05/26/2020 8:23 AM    [facility]

## 2020-05-26 ENCOUNTER — Encounter: Payer: Self-pay | Admitting: Interventional Cardiology

## 2020-05-26 ENCOUNTER — Ambulatory Visit (INDEPENDENT_AMBULATORY_CARE_PROVIDER_SITE_OTHER): Payer: 59 | Admitting: Interventional Cardiology

## 2020-05-26 ENCOUNTER — Other Ambulatory Visit: Payer: Self-pay

## 2020-05-26 ENCOUNTER — Other Ambulatory Visit: Payer: Self-pay | Admitting: Obstetrics & Gynecology

## 2020-05-26 VITALS — BP 102/62 | HR 65 | Ht 62.5 in | Wt 145.2 lb

## 2020-05-26 DIAGNOSIS — R079 Chest pain, unspecified: Secondary | ICD-10-CM

## 2020-05-26 DIAGNOSIS — Z8249 Family history of ischemic heart disease and other diseases of the circulatory system: Secondary | ICD-10-CM

## 2020-05-26 DIAGNOSIS — G4700 Insomnia, unspecified: Secondary | ICD-10-CM

## 2020-05-26 DIAGNOSIS — E785 Hyperlipidemia, unspecified: Secondary | ICD-10-CM

## 2020-05-26 DIAGNOSIS — Z87891 Personal history of nicotine dependence: Secondary | ICD-10-CM

## 2020-05-26 DIAGNOSIS — Z7189 Other specified counseling: Secondary | ICD-10-CM

## 2020-05-26 NOTE — Telephone Encounter (Signed)
Medication refill request: Zolpidem 5mg   Last AEX:  09/06/19 Next AEX: 11/14/20 Last MMG (if hormonal medication request): Na Refill authorized: 30/0

## 2020-05-26 NOTE — Patient Instructions (Signed)
Medication Instructions:  Your physician recommends that you continue on your current medications as directed. Please refer to the Current Medication list given to you today.  *If you need a refill on your cardiac medications before your next appointment, please call your pharmacy*   Lab Work: None If you have labs (blood work) drawn today and your tests are completely normal, you will receive your results only by: Marland Kitchen MyChart Message (if you have MyChart) OR . A paper copy in the mail If you have any lab test that is abnormal or we need to change your treatment, we will call you to review the results.   Testing/Procedures: Your physician recommends that you have a Calcium Score performed.    Follow-Up: At [facility], you and your health needs are our priority.  As part of our continuing mission to provide you with exceptional heart care, we have created designated Provider Care Teams.  These Care Teams include your primary Cardiologist (physician) and Advanced Practice Providers (APPs -  Physician Assistants and Nurse Practitioners) who all work together to provide you with the care you need, when you need it.  We recommend signing up for the patient portal called "MyChart".  Sign up information is provided on this After Visit Summary.  MyChart is used to connect with patients for Virtual Visits (Telemedicine).  Patients are able to view lab/test results, encounter notes, upcoming appointments, etc.  Non-urgent messages can be sent to your provider as well.   To learn more about what you can do with MyChart, go to [email or web address].    Your next appointment:   12 month(s)  The format for your next appointment:   In Person  Provider:   You may see Dr. Verdis Prime or one of the following Advanced Practice Providers on your designated Care Team:    Norma Fredrickson, NP  Nada Boozer, NP  Georgie Chard, NP    Other Instructions

## 2020-06-02 ENCOUNTER — Inpatient Hospital Stay: Admission: RE | Admit: 2020-06-02 | Payer: 59 | Source: Ambulatory Visit

## 2020-06-04 ENCOUNTER — Inpatient Hospital Stay: Admission: RE | Admit: 2020-06-04 | Payer: 59 | Source: Ambulatory Visit

## 2020-06-06 ENCOUNTER — Other Ambulatory Visit: Payer: Self-pay

## 2020-06-06 ENCOUNTER — Ambulatory Visit (INDEPENDENT_AMBULATORY_CARE_PROVIDER_SITE_OTHER)
Admission: RE | Admit: 2020-06-06 | Discharge: 2020-06-06 | Disposition: A | Discharge status: Home / Self Care | Payer: Self-pay | Source: Ambulatory Visit | Attending: Interventional Cardiology | Admitting: Interventional Cardiology

## 2020-06-06 DIAGNOSIS — E785 Hyperlipidemia, unspecified: Secondary | ICD-10-CM

## 2020-06-06 DIAGNOSIS — Z8249 Family history of ischemic heart disease and other diseases of the circulatory system: Secondary | ICD-10-CM

## 2020-06-17 ENCOUNTER — Encounter: Payer: Self-pay | Admitting: Obstetrics & Gynecology

## 2020-07-08 ENCOUNTER — Other Ambulatory Visit: Payer: Self-pay | Admitting: Obstetrics & Gynecology

## 2020-07-08 DIAGNOSIS — G4700 Insomnia, unspecified: Secondary | ICD-10-CM

## 2020-07-08 NOTE — Telephone Encounter (Signed)
Medication refill request: Ambien  Last AEX:  09-06-2019 SM  Next AEX: 11-14-20 Last MMG (if hormonal medication request): n/a Refill authorized: Today, please advise.   Medication pended for #30, 0RF. Please refill if appropriate.

## 2020-09-08 ENCOUNTER — Other Ambulatory Visit: Payer: Self-pay | Admitting: Obstetrics & Gynecology

## 2020-09-17 NOTE — Telephone Encounter (Signed)
Medication refill request: Imitrex 100mg  #11, NR Last AEX: 09-06-19 Next AEX: none Last MMG (if hormonal medication request): n/a Refill authorized: Please advise

## 2020-10-03 ENCOUNTER — Other Ambulatory Visit: Payer: Self-pay | Admitting: Obstetrics & Gynecology

## 2020-10-12 ENCOUNTER — Other Ambulatory Visit: Payer: Self-pay | Admitting: Obstetrics & Gynecology

## 2020-10-12 DIAGNOSIS — G4700 Insomnia, unspecified: Secondary | ICD-10-CM

## 2020-10-15 ENCOUNTER — Other Ambulatory Visit: Payer: Self-pay | Admitting: Obstetrics & Gynecology

## 2020-10-15 DIAGNOSIS — G4700 Insomnia, unspecified: Secondary | ICD-10-CM

## 2020-10-20 ENCOUNTER — Telehealth (HOSPITAL_BASED_OUTPATIENT_CLINIC_OR_DEPARTMENT_OTHER): Payer: Self-pay | Admitting: *Deleted

## 2020-10-20 NOTE — Telephone Encounter (Signed)
Next annual exam scheduled 11/14/20 1045am.   Pt called for Refill on generic Ambien 5mg  last given 07/10/20 as well as Generic Zoloft 50mg  last given 08/15/19 through a telephone encounter with [facility].   Pharmacy is [facility] [employer] on 14/9/20.  Please advise on prescription request.  Thanks [facility] CMA

## 2020-10-22 MED ORDER — SERTRALINE HCL 50 MG PO TABS: 50.0000 mg | ORAL_TABLET | Freq: Every day | ORAL | 1 refills | Status: DC

## 2020-11-14 ENCOUNTER — Ambulatory Visit: Payer: 59

## 2020-11-14 ENCOUNTER — Ambulatory Visit (INDEPENDENT_AMBULATORY_CARE_PROVIDER_SITE_OTHER): Payer: Self-pay | Admitting: Obstetrics & Gynecology

## 2020-11-14 ENCOUNTER — Encounter (HOSPITAL_BASED_OUTPATIENT_CLINIC_OR_DEPARTMENT_OTHER): Payer: Self-pay | Admitting: Obstetrics & Gynecology

## 2020-11-14 ENCOUNTER — Other Ambulatory Visit: Payer: Self-pay

## 2020-11-14 ENCOUNTER — Other Ambulatory Visit (HOSPITAL_COMMUNITY)
Admission: RE | Admit: 2020-11-14 | Discharge: 2020-11-14 | Disposition: A | Discharge status: Home / Self Care | Payer: 59 | Source: Ambulatory Visit | Attending: Obstetrics & Gynecology | Admitting: Obstetrics & Gynecology

## 2020-11-14 VITALS — BP 102/66 | Ht 63.0 in | Wt 156.2 lb

## 2020-11-14 DIAGNOSIS — R110 Nausea: Secondary | ICD-10-CM

## 2020-11-14 DIAGNOSIS — Z124 Encounter for screening for malignant neoplasm of cervix: Secondary | ICD-10-CM

## 2020-11-14 DIAGNOSIS — N951 Menopausal and female climacteric states: Secondary | ICD-10-CM

## 2020-11-14 DIAGNOSIS — Z01419 Encounter for gynecological examination (general) (routine) without abnormal findings: Secondary | ICD-10-CM

## 2020-11-14 DIAGNOSIS — G4700 Insomnia, unspecified: Secondary | ICD-10-CM

## 2020-11-14 DIAGNOSIS — Z9189 Other specified personal risk factors, not elsewhere classified: Secondary | ICD-10-CM

## 2020-11-14 MED ORDER — ZOLPIDEM TARTRATE 5 MG PO TABS: 5.0000 mg | ORAL_TABLET | Freq: Every day | ORAL | 5 refills | Status: DC

## 2020-11-14 MED ORDER — SERTRALINE HCL 50 MG PO TABS: 50.0000 mg | ORAL_TABLET | Freq: Every day | ORAL | 4 refills | Status: DC

## 2020-11-14 MED ORDER — ONDANSETRON 8 MG PO TBDP: 8.0000 mg | ORAL_TABLET | Freq: Three times a day (TID) | ORAL | 1 refills | Status: DC | PRN

## 2020-11-14 NOTE — Progress Notes (Signed)
55 y.o. G2P2 Married White or Caucasian female here for annual exam.  Doing well.  Considering breast MRI this year.  We will precert.  Lifetime risk >34%.  Bleeding is still normal.  Flow is about every 35-38.  Flow lasts 5-6 days.        Last thyroid testing was at [facility].  This was done a month ago.    Saw Dr. Katrinka Blazing.  Had coronary CT that was 0.    Husband has an estranged daughter that they now have reestablished a relationship with.  She has a 28 month old daughter.  Sexually active: Yes.    The current method of family planning is vasectomy.    Exercising:  Smoker:  no  Health Maintenance: Pap:  Obtained today History of abnormal Pap:  yes MMG:  06/2020 Colonoscopy:  03/28/2017 BMD:   Not indicated TDaP:  declines Pneumonia vaccine(s):  declines Shingrix:   declines Screening Labs: done last month.  She will give me a copy.     reports that she has quit smoking. She has never used smokeless tobacco. She reports that she does not drink alcohol and does not use drugs.  Past Medical History:  Diagnosis Date  . Allergic rhinitis    right leg swelling  . Back pain   . Back pain   . Chest pain   . Edema   . Heart burn   . Insomnia   . Racing heart beat     Past Surgical History:  Procedure Laterality Date  . BREAST SURGERY  10/2014   reduction done by Dr. Janae Sauce  . WISDOM TOOTH EXTRACTION      Current Outpatient Medications  Medication Sig Dispense Refill  . ALPRAZolam (XANAX) 0.5 MG tablet as needed.     Mack Guise THYROID 60 MG tablet Take 1 tablet by mouth daily.  2  . Cholecalciferol (VITAMIN D PO) Take 10,000 Units by mouth daily.     . cyclobenzaprine (FLEXERIL) 10 MG tablet Take 1 tablet (10 mg total) by mouth every 8 (eight) hours as needed. 30 tablet 0  . meloxicam (MOBIC) 7.5 MG tablet TAKE 1 TABLET BY MOUTH TWICE DAILY AS NEEDED FOR PAIN 60 tablet 7  . Menaquinone-7 (VITAMIN K2 PO) Take by mouth.    . NON FORMULARY Neuro support tablet    .  Nutritional Supplements (DHEA PO) Take by mouth.    . ondansetron (ZOFRAN-ODT) 8 MG disintegrating tablet Take 1 tablet (8 mg total) by mouth 3 (three) times daily as needed. Nausea and vomiting 20 tablet 0  . Pregnenolone POWD by Does not apply route.    . sertraline (ZOLOFT) 50 MG tablet Take 1 tablet (50 mg total) by mouth daily. 90 tablet 1  . SUMAtriptan (IMITREX) 100 MG tablet 1 po x 1, repeat in 2 hours as needed.  Max 200mg /24 hours. 11 tablet 5  . temazepam (RESTORIL) 15 MG capsule TAKE 1 CAPSULE BY MOUTH AT BEDTIME AS NEEDED FOR SLEEP 30 capsule 0  . triamterene-hydrochlorothiazide (MAXZIDE-25) 37.5-25 MG tablet     . vitamin B-12 (CYANOCOBALAMIN) 1000 MCG tablet Take by mouth.    . zolpidem (AMBIEN) 5 MG tablet TAKE 1 TABLET BY MOUTH EVERY DAY AT BEDTIME AS NEEDED FOR SLEEP 30 tablet 1   No current facility-administered medications for this visit.    Family History  Problem Relation Age of Onset  . Cancer Mother 38       breast  . Atrial fibrillation Mother   .  Kidney disease Mother   . Parkinson's disease Mother   . Dementia Mother   . Heart attack Father   . Heart attack Maternal Grandfather   . Heart attack Paternal Grandfather   . Cancer Paternal Aunt 16       ovarian  . Cancer Other        paternal great aunt (through [facility]) with ovarian cancer and several 1st cousins once removed (also through [facility]) with breast cancer  . Multiple sclerosis Brother     Review of Systems  All other systems reviewed and are negative.   Exam:   BP 102/66   Ht 5\' 3"  (1.6 m)   Wt 156 lb 3.2 oz (70.9 kg)   BMI 27.67 kg/m   Height: 5\' 3"  (160 cm)  General appearance: alert, cooperative and appears stated age Head: Normocephalic, without obvious abnormality, atraumatic Neck: no adenopathy, supple, symmetrical, trachea midline and thyroid normal to inspection and palpation Lungs: clear to auscultation bilaterally Breasts: normal appearance, no masses or tenderness Heart: regular  rate and rhythm Abdomen: soft, non-tender; bowel sounds normal; no masses,  no organomegaly Extremities: extremities normal, atraumatic, no cyanosis or edema Skin: Skin color, texture, turgor normal. No rashes or lesions Lymph nodes: Cervical, supraclavicular, and axillary nodes normal. No abnormal inguinal nodes palpated Neurologic: Grossly normal   Pelvic: External genitalia:  no lesions              Urethra:  normal appearing urethra with no masses, tenderness or lesions              Bartholins and Skenes: normal                 Vagina: normal appearing vagina with normal color and discharge, no lesions              Cervix: no lesions              Pap taken: Yes.   Bimanual Exam:  Uterus:  normal size, contour, position, consistency, mobility, non-tender              Adnexa: normal adnexa and no mass, fullness, tenderness               Rectovaginal: Confirms               Anus:  normal sphincter tone, no lesions  Chaperone, , CMA, was present for exam.  Assessment/Plan: 1. Well woman exam with routine gynecological exam - pap and HR HPV obtained today - MMG done 06/2020 - colonoscopy up to date - declines vaccines - pt will sent me lab work  2. Insomnia, unspecified type - zolpidem (AMBIEN) 5 MG tablet; Take 1 tablet (5 mg total) by mouth at bedtime.  Dispense: 30 tablet; Refill: 5  3. Perimenopausal  4. Increased risk of breast cancer - will plan MRI this year  5. Nausea - ondansetron (ZOFRAN-ODT) 8 MG disintegrating tablet; Take 1 tablet (8 mg total) by mouth 3 (three) times daily as needed. Nausea and vomiting  Dispense: 20 tablet; Refill: 1

## 2020-11-17 LAB — CYTOLOGY - PAP
Adequacy: ABSENT
Comment: NEGATIVE
Diagnosis: NEGATIVE
High risk HPV: NEGATIVE

## 2020-12-03 ENCOUNTER — Telehealth (HOSPITAL_BASED_OUTPATIENT_CLINIC_OR_DEPARTMENT_OTHER): Payer: Self-pay | Admitting: Obstetrics & Gynecology

## 2020-12-03 NOTE — Telephone Encounter (Signed)
Called patient about her insurance yesterday and patient stated that she will  call her insurance and call me back.

## 2020-12-17 ENCOUNTER — Other Ambulatory Visit (HOSPITAL_BASED_OUTPATIENT_CLINIC_OR_DEPARTMENT_OTHER): Payer: Self-pay | Admitting: Obstetrics & Gynecology

## 2020-12-17 DIAGNOSIS — Z9189 Other specified personal risk factors, not elsewhere classified: Secondary | ICD-10-CM

## 2020-12-28 ENCOUNTER — Other Ambulatory Visit: Payer: Self-pay | Admitting: Obstetrics & Gynecology

## 2021-01-09 ENCOUNTER — Ambulatory Visit
Admission: RE | Admit: 2021-01-09 | Discharge: 2021-01-09 | Disposition: A | Discharge status: Home / Self Care | Payer: Self-pay | Source: Ambulatory Visit | Attending: Obstetrics & Gynecology | Admitting: Obstetrics & Gynecology

## 2021-01-09 ENCOUNTER — Other Ambulatory Visit: Payer: Self-pay

## 2021-01-09 DIAGNOSIS — Z9189 Other specified personal risk factors, not elsewhere classified: Secondary | ICD-10-CM

## 2021-01-09 MED ORDER — GADOBUTROL 1 MMOL/ML IV SOLN
7.0000 mL | Freq: Once | INTRAVENOUS | Status: AC | PRN
  Administered 2021-01-09: 7 mL via INTRAVENOUS

## 2021-05-22 ENCOUNTER — Other Ambulatory Visit (HOSPITAL_BASED_OUTPATIENT_CLINIC_OR_DEPARTMENT_OTHER): Payer: Self-pay

## 2021-05-22 DIAGNOSIS — G4700 Insomnia, unspecified: Secondary | ICD-10-CM

## 2021-05-22 MED ORDER — ZOLPIDEM TARTRATE 5 MG PO TABS: 5.0000 mg | ORAL_TABLET | Freq: Every day | ORAL | 2 refills | Status: DC

## 2021-05-22 NOTE — Telephone Encounter (Signed)
Patient called in today requesting a refill for zolpidem 5mg . Patient was seen 11/2020 and was given 5 refills at that time. Patient has an appointment for annual exam for 11/20/2021. Please refill med.

## 2021-07-10 ENCOUNTER — Encounter (HOSPITAL_BASED_OUTPATIENT_CLINIC_OR_DEPARTMENT_OTHER): Payer: Self-pay | Admitting: *Deleted

## 2021-08-10 ENCOUNTER — Other Ambulatory Visit: Payer: Self-pay

## 2021-08-10 ENCOUNTER — Encounter (HOSPITAL_BASED_OUTPATIENT_CLINIC_OR_DEPARTMENT_OTHER): Payer: Self-pay | Admitting: Obstetrics & Gynecology

## 2021-08-10 ENCOUNTER — Ambulatory Visit (INDEPENDENT_AMBULATORY_CARE_PROVIDER_SITE_OTHER): Payer: Self-pay | Admitting: Obstetrics & Gynecology

## 2021-08-10 VITALS — BP 131/76 | HR 70 | Ht 63.0 in | Wt 169.6 lb

## 2021-08-10 DIAGNOSIS — Z6830 Body mass index (BMI) 30.0-30.9, adult: Secondary | ICD-10-CM

## 2021-08-10 DIAGNOSIS — F341 Dysthymic disorder: Secondary | ICD-10-CM

## 2021-08-10 DIAGNOSIS — G4700 Insomnia, unspecified: Secondary | ICD-10-CM

## 2021-08-10 DIAGNOSIS — N3946 Mixed incontinence: Secondary | ICD-10-CM

## 2021-08-10 MED ORDER — ZOLPIDEM TARTRATE 5 MG PO TABS: 5.0000 mg | ORAL_TABLET | Freq: Every day | ORAL | 2 refills | Status: AC

## 2021-08-10 MED ORDER — FLUOXETINE HCL 10 MG PO TABS: 10.0000 mg | ORAL_TABLET | Freq: Every day | ORAL | 6 refills | Status: DC

## 2021-08-10 NOTE — Progress Notes (Signed)
GYNECOLOGY  VISIT  CC:   several questions today  HPI: 55 y.o. G2P2 Married White or Caucasian female here for complaint of several issues.  First, she's gained about 30 pounds in the last two years.  She has tried optavia and would like to try something else.    She has increased urinary leakage that has occurred this past year.  She leaks mostly with activity, sneeze, cough, lifting.  She is having leakage with intercourse as well.  She may get up once at night but not every night.  Does have some urgency right before going to bed at night.  Also wants to talk about her sertriline.  Feels like she is emotionless with this medication and that she can't cry.  Otherwise, feels ok with this.  Would like to consider other options but not stop.  H/o insomnia. Needs RF for ambien.  Wants to know if dosage can be increased.  D/w pt 5mg  dosage as highest recommended dosage for women.  GYNECOLOGIC HISTORY: Patient's last menstrual period was 07/09/2021. 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 11/2014   WISDOM TOOTH EXTRACTION      MEDS:   Current Outpatient Medications on File Prior to Visit  Medication Sig Dispense Refill   ALPRAZolam (XANAX) 0.5 MG tablet as needed.      Cholecalciferol (VITAMIN D  PO) Take 10,000 Units by mouth daily.      Menaquinone-7 (VITAMIN K2 PO) Take by mouth.     Nutritional Supplements (DHEA PO) Take by mouth.     ondansetron (ZOFRAN-ODT) 8 MG disintegrating tablet Take 1 tablet (8 mg total) by mouth 3 (three) times daily as needed. Nausea and vomiting 20 tablet 1   sertraline (ZOLOFT) 50 MG tablet Take 1 tablet (50 mg total) by mouth daily. 90 tablet 4   SUMAtriptan (IMITREX) 100 MG tablet TAKE 1 TABLET BY MOUTH AT ONSET OF MIGRAINE. MAY REPEAT IN 2 HRS. MAX 2TABS/24HRS 11 tablet 5   vitamin B-12 (CYANOCOBALAMIN) 1000 MCG tablet Take by mouth.     zolpidem (AMBIEN) 5 MG tablet Take 1 tablet (5 mg total) by mouth at bedtime. 30 tablet 2   ARMOUR THYROID 60 MG tablet Take 1 tablet by mouth daily. (Patient not taking: Reported on 08/10/2021)  2   NON FORMULARY Neuro support tablet (Patient not taking: Reported on 08/10/2021)     Pregnenolone POWD by Does not apply route. (Patient not taking: Reported on 08/10/2021)     triamterene-hydrochlorothiazide (MAXZIDE-25) 37.5-25 MG tablet  (Patient not taking: Reported on 08/10/2021)     No current facility-administered medications on file prior to visit.  ALLERGIES: Penicillins  Family History  Problem Relation Age of Onset   Cancer Mother 61       breast   Atrial fibrillation Mother    Kidney disease Mother    Parkinson's disease Mother    Dementia Mother    Heart attack Father    Heart attack Maternal Grandfather    Heart attack Paternal Grandfather    Cancer Paternal Aunt 48       ovarian   Cancer Other        paternal great aunt (through [facility]) with ovarian cancer and several 1st cousins once removed (also through [facility]) with breast cancer   Multiple sclerosis Brother     SH:  married, non smoker  Review of Systems  Genitourinary:        Incontinence   PHYSICAL EXAMINATION:    BP 131/76 (BP Location: Right Arm, Patient Position: Sitting, Cuff Size: Large)   Pulse 70   Ht 5\' 3"  (1.6 m) Comment:  reported  Wt 169 lb 9.6 oz (76.9 kg)   LMP 07/09/2021   BMI 30.04 kg/m     General appearance: alert, cooperative and appears stated age Lymph:  no inguinal LAD noted  Pelvic: External genitalia:  no lesions              Urethra:  normal appearing urethra with no masses, tenderness or lesions, pt leaks in office with coughing              Bartholins and Skenes: normal                 Vagina: normal appearing vagina with normal color and discharge, no lesions, 2nd degree cystoscele              Cervix: no lesions              Bimanual Exam:  Uterus:  normal size, contour, position, consistency, mobility, non-tender              Adnexa: no mass, fullness, tenderness          Chaperone, 13/11/2020, CMA, was present for exam.  Assessment/Plan: 1. Mixed stress and urge urinary incontinence, primarily stress - Ambulatory referral to Urogynecology - Ambulatory referral to Physical Therapy - Urine Culture  2. Insomnia, unspecified type - zolpidem (AMBIEN) 5 MG tablet; Take 1 tablet (5 mg total) by mouth at bedtime.  Dispense: 30 tablet; Refill: 2  3. BMI 30.0-30.9,adult - d/w pt I do not feel comfortable prescribing weight loss medications but feel she would benefit from [facility] approach to weight loss. - Ambulatory referral to [facility]  4. Dysthymia - pt aware can just switch from zoloft to new medication without any tapering. - FLUoxetine (PROZAC) 10 MG tablet; Take 1 tablet (10 mg total) by mouth daily.  Dispense: 30 tablet; Refill: 6

## 2021-08-10 NOTE — Patient Instructions (Addendum)
[facility] [address] Sherian Maroon [place], [place] [address] [phone]  [facility] Address: [address], [place], [place] [address] Phone: [phone]

## 2021-08-11 LAB — URINE CULTURE

## 2021-09-14 ENCOUNTER — Ambulatory Visit (INDEPENDENT_AMBULATORY_CARE_PROVIDER_SITE_OTHER): Payer: No Typology Code available for payment source | Admitting: Obstetrics and Gynecology

## 2021-09-14 ENCOUNTER — Encounter: Payer: Self-pay | Admitting: Obstetrics and Gynecology

## 2021-09-14 ENCOUNTER — Other Ambulatory Visit: Payer: Self-pay

## 2021-09-14 VITALS — BP 146/85 | HR 72 | Ht 62.0 in | Wt 169.0 lb

## 2021-09-14 DIAGNOSIS — R350 Frequency of micturition: Secondary | ICD-10-CM | POA: Diagnosis not present

## 2021-09-14 DIAGNOSIS — N393 Stress incontinence (female) (male): Secondary | ICD-10-CM | POA: Diagnosis not present

## 2021-09-14 LAB — POCT URINALYSIS DIPSTICK
Appearance: NORMAL
Bilirubin, UA: NEGATIVE
Blood, UA: NEGATIVE
Glucose, UA: NEGATIVE
Ketones, UA: NEGATIVE
Leukocytes, UA: NEGATIVE
Nitrite, UA: NEGATIVE
Protein, UA: NEGATIVE
Spec Grav, UA: 1.015 (ref 1.010–1.025)
Urobilinogen, UA: 0.2 E.U./dL
pH, UA: 6.5 (ref 5.0–8.0)

## 2021-09-14 NOTE — Progress Notes (Signed)
[facility] New Patient Evaluation and Consultation  Referring Provider: Megan Salon, MD PCP: Patient, No Pcp Per (Inactive) Date of Service: 09/14/2021  SUBJECTIVE Chief Complaint: New Patient (Initial Visit) Charlotte Velazquez is a 56 y.o. female here for incontinence./)  History of Present Illness: Charlotte Velazquez is a 56 y.o. White or Caucasian female seen in consultation at the request of Dr. Sabra Heck for evaluation of incontinence.    Review of records from Dr Sabra Heck significant for: Leaks with activity and with intercourse.   Urinary Symptoms: Leaks urine with cough/ sneeze, laughing, exercise, lifting, during sex, with a full bladder, and with movement to the bathroom. Urgency is rare.  Leaks 6-10 time(s) per day.  Pad use: 2 liners/ mini-pads per day.   She is bothered by her UI symptoms. Has an appointment with physical therapy.   Day time voids 6-8.  Nocturia: 1 times per night to void. Voiding dysfunction: she empties her bladder well.  does not use a catheter to empty bladder.  When urinating, she feels dribbling after finishing and the need to urinate multiple times in a row   UTIs: 1 UTI's in the last year.   Denies history of blood in urine and kidney or bladder stones  Pelvic Organ Prolapse Symptoms:                  She Denies a feeling of a bulge the vaginal area.   Bowel Symptom: Bowel movements: 1-2 time(s) per day Stool consistency: soft  Straining: no.  Splinting: no.  Incomplete evacuation: no.  She Denies accidental bowel leakage / fecal incontinence Bowel regimen: stool softener and mag citrate Last colonoscopy: Date- 2019, Results- 1 polyp removed  Sexual Function Sexually active: yes.  Sexual orientation:  heterosexual Pain with sex: No  Pelvic Pain Denies pelvic pain   Past Medical History:  Past Medical History:  Diagnosis Date   Allergic rhinitis    right leg swelling   Back pain    Back pain    Chest pain     Edema    Heart burn    Insomnia    Racing heart beat      Past Surgical History:   Past Surgical History:  Procedure Laterality Date   BREAST SURGERY  10/2014   reduction done by Dr. Emeline Darling   WISDOM TOOTH EXTRACTION       Past OB/GYN History: OB History  Gravida Para Term Preterm AB Living  2 2       2   SAB IAB Ectopic Multiple Live Births               # Outcome Date GA Lbr Len/2nd Weight Sex Delivery Anes PTL Lv  2 Para 10/18/98    F Vag-Spont     1 Para 01/29/89    M Vag-Spont      Patient's last menstrual period was 08/13/2021.  Contraception: vasectomy. Last pap smear was 2022- neg.    Medications: She has a current medication list which includes the following prescription(s): alprazolam, armour thyroid, vitamin d, nutritional supplements, ondansetron, vitamin b-12, zolpidem, menaquinone-7, and sumatriptan.   Allergies: Patient is allergic to penicillins.   Social History:  Social History   Tobacco Use   Smoking status: Former   Smokeless tobacco: Never  Scientific laboratory technician Use: Never used  Substance Use Topics   Alcohol use: No   Drug use: No    Relationship status: married She lives with husband.  She is employed as an Sales promotion account executive. Regular exercise: No History of abuse: No  Family History:   Family History  Problem Relation Age of Onset   Cancer Mother 71       breast   Atrial fibrillation Mother    Kidney disease Mother    Parkinson's disease Mother    Dementia Mother    Heart attack Father    Heart attack Maternal Grandfather    Heart attack Paternal Grandfather    Cancer Paternal Aunt 35       ovarian   Cancer Other        paternal great aunt (through [facility]) with ovarian cancer and several 1st cousins once removed (also through [facility]) with breast cancer   Multiple sclerosis Brother      Review of Systems: Review of Systems  Constitutional:  Negative for fever, malaise/fatigue and weight loss.  Respiratory:  Negative for  cough, shortness of breath and wheezing.   Cardiovascular:  Negative for chest pain, palpitations and leg swelling.  Gastrointestinal:  Negative for abdominal pain and blood in stool.  Genitourinary:  Negative for dysuria.  Musculoskeletal:  Negative for myalgias.  Skin:  Negative for rash.  Neurological:  Negative for dizziness and headaches.  Endo/Heme/Allergies:  Does not bruise/bleed easily.  Psychiatric/Behavioral:  Negative for depression. The patient is not nervous/anxious.     OBJECTIVE Physical Exam: Vitals:   09/14/21 1619  BP: (!) 146/85  Pulse: 72  Weight: 169 lb (76.7 kg)  Height: 5\' 2"  (1.575 m)    Physical Exam Constitutional:      General: She is not in acute distress. Pulmonary:     Effort: Pulmonary effort is normal.  Abdominal:     General: There is no distension.     Palpations: Abdomen is soft.     Tenderness: There is no abdominal tenderness. There is no rebound.  Musculoskeletal:        General: No swelling. Normal range of motion.  Skin:    General: Skin is warm and dry.     Findings: No rash.  Neurological:     Mental Status: She is alert and oriented to person, place, and time.  Psychiatric:        Mood and Affect: Mood normal.        Behavior: Behavior normal.     GU / Detailed Urogynecologic Evaluation:  Pelvic Exam: Normal external female genitalia; Bartholin's and Skene's glands normal in appearance; urethral meatus normal in appearance, no urethral masses or discharge.   CST: positive  Speculum exam reveals normal vaginal mucosa without atrophy. Cervix normal appearance. Uterus normal single, nontender. Adnexa no mass, fullness, tenderness.     Pelvic floor strength II/V  Pelvic floor musculature: Right levator non-tender, Right obturator non-tender, Left levator non-tender, Left obturator non-tender  POP-Q:   POP-Q  -3                                            Aa   -3                                           Ba  -9  C   3.5                                            Gh  3                                            Pb  11                                            tvl   -1                                            Ap  -1                                            Bp  -11                                              D     Rectal Exam:  Normal external rectum  Post-Void Residual (PVR) by Bladder Scan: In order to evaluate bladder emptying, we discussed obtaining a postvoid residual and she agreed to this procedure.  Procedure: The ultrasound unit was placed on the patient's abdomen in the suprapubic region after the patient had voided. A PVR of 2 ml was obtained by bladder scan.  Laboratory Results: POC urine: negative   ASSESSMENT AND PLAN Ms. Jerry is a 56 y.o. with:  1. SUI (stress urinary incontinence, female)   2. Urinary frequency     SUI - For treatment of stress urinary incontinence,  non-surgical options include expectant management, weight loss, physical therapy, as well as a pessary.  Surgical options include a midurethral sling,  and transurethral injection of a bulking agent. - She has an appt with physical therapy and will start with that first to see if she has an improvement. Otherwise she is leaning toward urethral bulking.   2. Urinary urgency/ frequency - more rare, did not go into detail about treatment  Return after PT to review progress  Jaquita Folds, MD

## 2021-09-15 ENCOUNTER — Encounter: Payer: Self-pay | Admitting: Obstetrics and Gynecology

## 2021-09-17 ENCOUNTER — Other Ambulatory Visit: Payer: Self-pay | Admitting: Physician Assistant

## 2021-09-17 ENCOUNTER — Other Ambulatory Visit (HOSPITAL_COMMUNITY): Payer: Self-pay | Admitting: Physician Assistant

## 2021-09-22 ENCOUNTER — Ambulatory Visit: Payer: No Typology Code available for payment source | Attending: Obstetrics & Gynecology

## 2021-09-22 ENCOUNTER — Other Ambulatory Visit: Payer: Self-pay

## 2021-09-22 DIAGNOSIS — N3946 Mixed incontinence: Secondary | ICD-10-CM | POA: Diagnosis not present

## 2021-09-22 DIAGNOSIS — R279 Unspecified lack of coordination: Secondary | ICD-10-CM | POA: Diagnosis not present

## 2021-09-22 DIAGNOSIS — M6281 Muscle weakness (generalized): Secondary | ICD-10-CM | POA: Diagnosis not present

## 2021-09-22 NOTE — Patient Instructions (Signed)
Strict bladder retraining program for 2 weeks 4-6oz of water every hour - sip on the water throughout the hour Your voiding window is 2-3 hours - this may not be perfect at the beginning, but use the urge suppression technique to help get you as far into this window as you can Only water (for just 2 weeks!) Stop any night fluids 3 hours before bed Urge suppression technique: 5 quick kegels when you are trying to make your voiding window or you feel like you won't get to the bathroom in time After you perform the contractions, take a couple deep breaths to help relax and go do something else to distract you Repeat as many times as you need to get closer to your voiding window or make it to the toilet Pelvic floor strengthening program: Quick flicks (kegels) 2 x 10  2-3x/day With breathing, exhale contract, inhale relax You can use your own finger for feedback here Start lying down, progress to seated and standing positions Long holds 5 x 10sec holds 2-3x/day Try to initially contract on exhale, and then breathe throughout - if you can Recontract as the contraction fades Start lying down, progress to seated and standing positions The knack Contract before lift, cough, laugh sneeze Do these things loudly!  [place] [address], [place], [place] [address] Phone # [phone] Fax [phone]

## 2021-09-22 NOTE — Therapy (Signed)
[facility] @ [place] [place], [place], [address] Phone: [phone]   Fax:  [phone]  Physical Therapy Evaluation  Patient Details  Name: Charlotte Velazquez MRN: [id number] Date of Birth: May 28, 1966 Referring Provider (PT): Megan Salon, MD   Encounter Date: 09/22/2021   PT End of Session - 09/22/21 1027     Visit Number 1    Date for PT Re-Evaluation 12/15/21    Authorization Type [employer]    PT Start Time 0930    PT Stop Time 1015    PT Time Calculation (min) 45 min    Activity Tolerance Patient tolerated treatment well    Behavior During Therapy [facility] for tasks assessed/performed             Past Medical History:  Diagnosis Date   Allergic rhinitis    right leg swelling   Back pain    Back pain    Chest pain    Edema    Heart burn    Insomnia    Racing heart beat     Past Surgical History:  Procedure Laterality Date   BREAST SURGERY  10/2014   reduction done by Dr. Emeline Darling   WISDOM TOOTH EXTRACTION      There were no vitals filed for this visit.    Subjective Assessment - 09/22/21 0927     Subjective Pt is a 56 year old female with chief complaint of SUI with lifting, sneezing, coughing; she also reports that she has some urgency and will start leaking on the way to the bathroom. She states this has been happening for years that has been getting worse in the last couple of years. She have not performed any pelvic floor strengthening. 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    How long can you sit comfortably? no limitations    How long can you stand comfortably? no limitations    How long can you walk comfortably? no limitations    Patient Stated Goals Decrease urinary leaking; get good exercise program.    Currently in Pain? No/denies    Multiple Pain Sites No                [facility] PT Assessment - 09/22/21 0001       Assessment   Medical Diagnosis  N39.46 (ICD-10-CM) - Mixed stress and urge urinary incontinence    Referring Provider (PT) Megan Salon, MD    Onset Date/Surgical Date 09/07/91   chronic, worse in last couple of years, after son born 39 years ago   Next MD Visit 11/20/21    Prior Therapy no      Precautions   Precautions None      Restrictions   Weight Bearing Restrictions No      Balance Screen   Has the patient fallen in the past 6 months No    Has the patient had a decrease in activity level because of a fear of falling?  No    Is the patient reluctant to leave their home because of a fear of falling?  No      Home Ecologist residence    Living Arrangements Spouse/significant other      Prior Function   Level of Independence Independent    Vocation Full time employment    Animal nutritionist for [employer]   Overall Cognitive Status Within  Functional Limits for tasks assessed      Posture/Postural Control   Posture Comments WNL                    No emotional/communication barriers or cognitive limitation. Patient is motivated to learn. Patient understands and agrees with treatment goals and plan. PT explains patient will be examined in standing, sitting, and lying down to see how their muscles and joints work. When they are ready, they will be asked to remove their underwear so PT can examine their perineum. The patient is also given the option of providing their own chaperone as one is not provided in our facility. The patient also has the right and is explained the right to defer or refuse any part of the evaluation or treatment including the internal exam. With the patient's consent, PT will use one gloved finger to gently assess the muscles of the pelvic floor, seeing how well it contracts and relaxes and if there is muscle symmetry. After, the patient will get dressed and PT and patient will discuss exam  findings and plan of care. PT and patient discuss plan of care, schedule, attendance policy and HEP activities.     Objective measurements completed on examination: See above findings.     Pelvic Floor Special Questions - 09/22/21 0001     Prior Pelvic/Prostate Exam Yes    Are you Pregnant or attempting pregnancy? No    Prior Pregnancies Yes    Number of Pregnancies 2    Number of C-Sections 0    Number of Vaginal Deliveries 2    Any difficulty with labor and deliveries No    Episiotomy Performed Yes   and tearing   Currently Sexually Active Yes    Is this Painful No   1-2x of urinary leaking - unsure of happening with motion or orgasm   History of sexually transmitted disease No    Urinary Leakage Yes    How often multiple times daily    Pad use 2    Activities that cause leaking Coughing;Sneezing;Laughing;Lifting;Walking;Intercourse    Urinary urgency Yes    Urinary frequency 12    Fecal incontinence No   she does report constipation; take mg citrate; straining   Fluid intake 50-60oz a day    Caffeine beverages coffee in the morning, sometimes decaf    Falling out feeling (prolapse) No    External Perineal Exam yes    Skin Integrity Intact    Scar Tear;Episiotomy   mild scar tissue restriction, no discomfort   Perineal Body/Introitus  Normal    External Palpation WNL    Prolapse Anterior Wall   minimal, not reached grade 1   Pelvic Floor Internal Exam Pt identity confirmed and verbal consent provided for internal pelvic exam    Exam Type Vaginal    Sensation WNL    Palpation WNL    Strength Flicker   Improvements with treatment/retraining to 2-3/5   Strength # of reps 5    Strength # of seconds 6    Tone WNL              [facility] Adult PT Treatment/Exercise - 09/22/21 0001       Self-Care   Self-Care Other Self-Care Comments   Strict bladder retraining program to include voiding schedule 2-3 hours, fluid intake timing, and urge suppression technique to help  achieve voiding window; the knack to help reduce SUI     Neuro Re-ed    Neuro  Re-ed Details  pelvic floor muscle strengthening: 10 quick flicks with breath coordination and VC/TCs for increase in strength, isometric holds 5 x 10 seconds with verbal cues to requeeze when contraction starts to fade; urge suppression quick flicks 2 x 5 with cuing to increase range of motion and slow contractions                     PT Education - 09/22/21 1025     Education Details Pt education performed on pelvic floor muscle anatomy, benefit of PT intervention at this stage of dysfunction, strict bladder retraining, urge suppression technique, the knack, and initial HEP for pelvic floor strengthening.    Person(s) Educated Patient    Methods Explanation;Demonstration;Tactile cues;Verbal cues;Handout    Comprehension Verbalized understanding              PT Short Term Goals - 09/22/21 1040       PT SHORT TERM GOAL #1   Title 1. Pt will be independent with HEP in 2 weeks.    Time 2    Period Weeks    Status New    Target Date 10/06/21      PT SHORT TERM GOAL #2   Title Pt will be independent and provide teach back of urge suppression technique and the knack in order to help decrease urinary incontinence.    Time 4    Period Weeks    Status New    Target Date 10/20/21      PT SHORT TERM GOAL #3   Title Pt will demonstrate 4/5 pelvic floor muscle strength in order to help reduce urinary incontinence.    Time 4    Period Weeks    Status New    Target Date 10/20/21               PT Long Term Goals - 09/22/21 1043       PT LONG TERM GOAL #1   Title Pt will be independent with advanced HEP in 8 weeks.    Time 8    Period Weeks    Status New    Target Date 11/17/21      PT LONG TERM GOAL #2   Title Pt will report no leaks with lifting, walking, laughing, coughing, sneezing in order to improve comfort with interpersonal relationships and community activities in 8 weeks.     Time 8    Period Weeks    Status New    Target Date 11/17/21      PT LONG TERM GOAL #3   Title Pt will be able to achieve 2-3 hour voiding windows in order to have better work and home life balance.    Time 8    Period Weeks    Status New    Target Date 11/17/21      PT LONG TERM GOAL #4   Title Pt will report no more than 1-2 episodes of urinary incontinence a week.    Time 8    Period Weeks    Status New    Target Date 11/17/21                    Plan - 09/22/21 1028     Clinical Impression Statement Pt is a 56 year old female with chief complaint of stress and urgency incontinence for years but has recently gotten worse. Exam findings notable for pelvic floor weakness 1/5, decreased pelvic floor endurance 6 seconds of 1/5 strength,  5 consecutive contractions of 1/5 strength, poor coordination of breathing with pelvic contraction, and minimal anterior vaginal wall laxity. Signs and symptoms most consistent with poor coordination/proprioception of pelvic floor contraction, pelvic floor weakness and reduced endurance, and poor bladder habits. She tolerated initial treatment of pelvic floor contraction training very well demosntrated by improvement from 1/5 strength to 3/5 strength; further treatment included pelvic floor isometric holds, urge suppression technique, the knack, strict bladder retraining education. We discussed importance of whole body strengthening program and cardiovascular fitness on pelvic floor function. She will benefit from skilled PT intervention in order to address impairments, decrease urinary leaking, and correct poor bladder habits.    Personal Factors and Comorbidities Comorbidity 1    Comorbidities 2 vaignal deliveries with tearing/episiotomies    Examination-Activity Limitations Continence;Hygiene/Grooming    Examination-Participation Restrictions Community Activity;Interpersonal Relationship    Stability/Clinical Decision Making  Stable/Uncomplicated    Clinical Decision Making Low    Rehab Potential Excellent    PT Frequency Monthy    PT Duration 12 weeks    PT Treatment/Interventions ADLs/Self Care Home Management;Biofeedback;Cryotherapy;Electrical Stimulation;Moist Heat;Neuromuscular re-education;Therapeutic exercise;Therapeutic activities;Patient/family education;Manual techniques;Scar mobilization;Spinal Manipulations    PT Next Visit Plan Consider biofeedback to progress pelvic floor strengthening program; progress pelvic floor strengthening to include various seated, quadruped, and standing positions; incorporate pelvic floor strengthening into functional activities.    PT Home Exercise Plan See patient instructions.    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

## 2021-09-23 ENCOUNTER — Other Ambulatory Visit (HOSPITAL_COMMUNITY): Payer: Self-pay | Admitting: Physician Assistant

## 2021-09-23 ENCOUNTER — Other Ambulatory Visit: Payer: Self-pay | Admitting: Physician Assistant

## 2021-09-23 DIAGNOSIS — M545 Low back pain, unspecified: Secondary | ICD-10-CM

## 2021-09-30 ENCOUNTER — Ambulatory Visit (HOSPITAL_BASED_OUTPATIENT_CLINIC_OR_DEPARTMENT_OTHER)
Admission: RE | Admit: 2021-09-30 | Discharge: 2021-09-30 | Disposition: A | Discharge status: Home / Self Care | Payer: No Typology Code available for payment source | Source: Ambulatory Visit | Attending: Physician Assistant | Admitting: Physician Assistant

## 2021-09-30 ENCOUNTER — Other Ambulatory Visit: Payer: Self-pay

## 2021-09-30 DIAGNOSIS — M545 Low back pain, unspecified: Secondary | ICD-10-CM | POA: Insufficient documentation

## 2021-10-21 ENCOUNTER — Other Ambulatory Visit: Payer: Self-pay

## 2021-10-21 ENCOUNTER — Ambulatory Visit: Payer: No Typology Code available for payment source | Attending: Obstetrics & Gynecology

## 2021-10-21 DIAGNOSIS — M6281 Muscle weakness (generalized): Secondary | ICD-10-CM | POA: Insufficient documentation

## 2021-10-21 DIAGNOSIS — R279 Unspecified lack of coordination: Secondary | ICD-10-CM | POA: Insufficient documentation

## 2021-10-21 NOTE — Therapy (Signed)
[facility] @ [place], [place], [address] Phone: [phone]   Fax:  [phone]  Physical Therapy Treatment  Patient Details  Name: Charlotte Velazquez MRN: [id number] Date of Birth: Feb 21, 1966 Referring Provider (PT): Megan Salon, MD   Encounter Date: 10/21/2021   PT End of Session - 10/21/21 1145     Visit Number 2    Date for PT Re-Evaluation 12/15/21    Authorization Type [employer]    PT Start Time 1101    PT Stop Time 1145    PT Time Calculation (min) 44 min    Activity Tolerance Patient tolerated treatment well    Behavior During Therapy [facility] for tasks assessed/performed             Past Medical History:  Diagnosis Date   Allergic rhinitis    right leg swelling   Back pain    Back pain    Chest pain    Edema    Heart burn    Insomnia    Racing heart beat     Past Surgical History:  Procedure Laterality Date   BREAST SURGERY  10/2014   reduction done by Dr. Emeline Darling   WISDOM TOOTH EXTRACTION      There were no vitals filed for this visit.   Subjective Assessment - 10/21/21 1104     Subjective Pt states that she has learned that caffiene makes all of her problems worse. She does feel like contractions are getting easier, but she has toruble remembering to do them. She states that the knack is very helpful.    Patient Stated Goals Decrease urinary leaking; get good exercise program.    Currently in Pain? No/denies    Multiple Pain Sites No                    No emotional/communication barriers or cognitive limitation. Patient is motivated to learn. Patient understands and agrees with treatment goals and plan. PT explains patient will be examined in standing, sitting, and lying down to see how their muscles and joints work. When they are ready, they will be asked to remove their underwear so PT can examine their perineum. The patient is also given the option of providing their  own chaperone as one is not provided in our facility. The patient also has the right and is explained the right to defer or refuse any part of the evaluation or treatment including the internal exam. With the patient's consent, PT will use one gloved finger to gently assess the muscles of the pelvic floor, seeing how well it contracts and relaxes and if there is muscle symmetry. After, the patient will get dressed and PT and patient will discuss exam findings and plan of care. PT and patient discuss plan of care, schedule, attendance policy and HEP activities.            [facility] Adult PT Treatment/Exercise - 10/21/21 0001       Neuro Re-ed    Neuro Re-ed Details  pelvic floor contraction training with quick flicks 2 x 10, long holds 10 x 10 sec with internal feedback to increase coordination      Exercises   Exercises Lumbar      Lumbar Exercises: Stretches   Hip Flexor Stretch Right;Left;1 rep;60 seconds    Piriformis Stretch Right;Left;1 rep;60 seconds      Lumbar Exercises: Standing   Other Standing Lumbar Exercises Pelvic floor contraction in regular  stance, wide stance, and tandom stance 10x each      Lumbar Exercises: Supine   Bridge with Ball Squeeze 20 reps      Lumbar Exercises: Sidelying   Clam 20 reps;Right;Left      Lumbar Exercises: Quadruped   Other Quadruped Lumbar Exercises pelvic floor contractions 10x                     PT Education - 10/21/21 1141     Education Details Pt education performed on exercise progressions and updated HEP.    Person(s) Educated Patient    Methods Explanation;Demonstration;Tactile cues;Verbal cues;Handout    Comprehension Verbalized understanding              PT Short Term Goals - 10/21/21 1146       PT SHORT TERM GOAL #1   Title 1. Pt will be independent with HEP in 2 weeks.    Time 2    Period Weeks    Status Achieved    Target Date 10/06/21      PT SHORT TERM GOAL #2   Title Pt will be independent  and provide teach back of urge suppression technique and the knack in order to help decrease urinary incontinence.    Time 4    Period Weeks    Status Achieved    Target Date 10/20/21      PT SHORT TERM GOAL #3   Title Pt will demonstrate 4/5 pelvic floor muscle strength in order to help reduce urinary incontinence.    Time 4    Period Weeks    Status Partially Met    Target Date 10/20/21               PT Long Term Goals - 10/21/21 1146       PT LONG TERM GOAL #1   Title Pt will be independent with advanced HEP in 8 weeks.    Time 8    Period Weeks    Status Partially Met    Target Date 11/17/21      PT LONG TERM GOAL #2   Title Pt will report no leaks with lifting, walking, laughing, coughing, sneezing in order to improve comfort with interpersonal relationships and community activities in 8 weeks.    Time 8    Period Weeks    Status Partially Met    Target Date 11/17/21      PT LONG TERM GOAL #3   Title Pt will be able to achieve 2-3 hour voiding windows in order to have better work and home life balance.    Time 8    Period Weeks    Status Partially Met    Target Date 11/17/21      PT LONG TERM GOAL #4   Title Pt will report no more than 1-2 episodes of urinary incontinence a week.    Time 8    Period Weeks    Status Partially Met    Target Date 11/17/21                   Plan - 10/21/21 1120     Clinical Impression Statement Pt has made excellent improvements in her pelvic floor motor control with quick flick and long hold contractions with strength being 3/5 without cuing; she had a 6 second endurance hold before contraction started to fade and some diffiuclty re-squeezing. She was able to progres contractions to various positions, starting with quadruped and working towards  standing positions. She reported increased difficulty accessing contraction in all new positions, but able to improve with repetitions. Pt believe's she will be able to more  easily incorporate strengthenign exercises into schedule with these more functional positions. She will continue to benefit from skilled PT intervention in order to progress pelvic floor and functional strengthening.    PT Treatment/Interventions ADLs/Self Care Home Management;Biofeedback;Cryotherapy;Electrical Stimulation;Moist Heat;Neuromuscular re-education;Therapeutic exercise;Therapeutic activities;Patient/family education;Manual techniques;Scar mobilization;Spinal Manipulations    PT Next Visit Plan Progress functional strengthening to pt tolerance; progress pelvic floor strengthening in functional positions.    PT Home Exercise Plan [email or web address]    Consulted and Agree with Plan of Care Patient             Patient will benefit from skilled therapeutic intervention in order to improve the following deficits and impairments:  Decreased coordination, Decreased range of motion, Impaired tone, Decreased endurance, Decreased activity tolerance, Decreased scar mobility, Decreased mobility, Decreased strength  Visit Diagnosis: Muscle weakness (generalized)  Unspecified lack of coordination     Problem List Patient Active Problem List   Diagnosis Date Noted   Chest pain 10/25/2018   Atherosclerosis 05/22/2017   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

## 2021-10-21 NOTE — Patient Instructions (Addendum)
Pelvic floor strengthening:  2-3x a day:  -2 x 10 quick flicks -5 x 10 second holds  In these various positions: -seated -all 4s -regular standing -wide stance -staggered stance  Access Code: [email or web address] URL: [email or web address] Date: 10/21/2021 Prepared by: Julio Alm  Exercises Cat Cow - 1 x daily - 7 x weekly - 2 sets - 10 reps Supine Bridge with Mini Swiss Ball Between Knees - 1 x daily - 7 x weekly - 2 sets - 10 reps Clamshell - 1 x daily - 7 x weekly - 2 sets - 10 reps Supine Piriformis Stretch with Foot on Ground - 1 x daily - 7 x weekly - 1 sets - 3 reps - 30 hold Half Kneeling Hip Flexor Stretch - 1 x daily - 7 x weekly - 1 sets - 3 reps - 30 hold

## 2021-11-11 ENCOUNTER — Other Ambulatory Visit: Payer: Self-pay

## 2021-11-11 ENCOUNTER — Ambulatory Visit: Payer: No Typology Code available for payment source | Attending: Obstetrics & Gynecology

## 2021-11-11 DIAGNOSIS — R279 Unspecified lack of coordination: Secondary | ICD-10-CM | POA: Insufficient documentation

## 2021-11-11 DIAGNOSIS — M6281 Muscle weakness (generalized): Secondary | ICD-10-CM | POA: Diagnosis present

## 2021-11-11 NOTE — Patient Instructions (Signed)
Access Code: [email or web address] ?URL: [email or web address] ?Date: 11/11/2021 ?Prepared by: Julio Alm ? ?Exercises ?Cat Cow - 1 x daily - 7 x weekly - 2 sets - 10 reps ?Supine Bridge with Mini Swiss Ball Between Knees - 1 x daily - 7 x weekly - 2 sets - 10 reps ?Clamshell - 1 x daily - 7 x weekly - 2 sets - 10 reps ?Supine Piriformis Stretch with Foot on Ground - 1 x daily - 7 x weekly - 1 sets - 3 reps - 30 hold ?Half Kneeling Hip Flexor Stretch - 1 x daily - 7 x weekly - 1 sets - 3 reps - 30 hold ?Supine Dead Bug with Leg Extension - 1 x daily - 7 x weekly - 2 sets - 10 reps ?Side Plank with Clam - 1 x daily - 7 x weekly - 1 sets - 10 reps ?Marching Bridge - 1 x daily - 7 x weekly - 2 sets - 10 reps ?Squat with Chair Touch - 1 x daily - 7 x weekly - 2 sets - 10 reps ?Shoulder extension with resistance - Neutral - 1 x daily - 7 x weekly - 2 sets - 10 reps ?Standing Anti-Rotation Press with Anchored Resistance - 1 x daily - 7 x weekly - 2 sets - 10 reps ? ?

## 2021-11-11 NOTE — Therapy (Addendum)
[place] [facility] @ [place], [place], [address] Phone: [phone]   Fax:  [phone]  Physical Therapy Treatment  Patient Details  Name: Charlotte Velazquez MRN: [id number] Date of Birth: 26-Feb-1966 Referring Provider (PT): Megan Salon, MD   Encounter Date: 11/11/2021   PT End of Session - 11/11/21 1107     Visit Number 3    Date for PT Re-Evaluation 12/15/21    Authorization Type [employer]    PT Start Time 1106    PT Stop Time 1145    PT Time Calculation (min) 39 min    Activity Tolerance Patient tolerated treatment well    Behavior During Therapy [facility] for tasks assessed/performed             Past Medical History:  Diagnosis Date   Allergic rhinitis    right leg swelling   Back pain    Back pain    Chest pain    Edema    Heart burn    Insomnia    Racing heart beat     Past Surgical History:  Procedure Laterality Date   BREAST SURGERY  10/2014   reduction done by Dr. Emeline Darling   WISDOM TOOTH EXTRACTION      There were no vitals filed for this visit.   Subjective Assessment - 11/11/21 1107     Subjective Pt states that she is dong very well. She went 3 days straight without any leakage. She is still having trouble when she sneezes, even when she is doing the knack. She is not leaking with walking even when bladder is full.    Patient Stated Goals Decrease urinary leaking; get good exercise program.    Currently in Pain? No/denies    Multiple Pain Sites No                               [facility] Adult PT Treatment/Exercise - 11/11/21 0001       Self-Care   Self-Care Other Self-Care Comments   poise impressa     Neuro Re-ed    Neuro Re-ed Details  trasnversus abdominus training with multimodal cues; incorporated into supine march 2 x 10 and bil leg extensions 10x      Lumbar Exercises: Standing   Functional Squats 20 reps    Shoulder Extension Strengthening;20  reps;Theraband    Theraband Level (Shoulder Extension) Level 3 (Green)    Other Standing Lumbar Exercises Pallof green band 10x bil      Lumbar Exercises: Supine   Dead Bug 20 reps    Bridge with March 20 reps      Lumbar Exercises: Sidelying   Other Sidelying Lumbar Exercises Side bridge with clam shell 10x bil                     PT Education - 11/11/21 1119     Education Details Pt education performed on poise impressa and sample sizing kit given; discussed situations that may be helpful to use in. Discussed schedule movingforward and that several more visits may be beneficial.    Person(s) Educated Patient    Methods Explanation;Demonstration;Tactile cues;Verbal cues;Handout    Comprehension Verbalized understanding              PT Short Term Goals - 10/21/21 1146       PT SHORT TERM GOAL #1   Title 1. Pt will be  independent with HEP in 2 weeks.    Time 2    Period Weeks    Status Achieved    Target Date 10/06/21      PT SHORT TERM GOAL #2   Title Pt will be independent and provide teach back of urge suppression technique and the knack in order to help decrease urinary incontinence.    Time 4    Period Weeks    Status Achieved    Target Date 10/20/21      PT SHORT TERM GOAL #3   Title Pt will demonstrate 4/5 pelvic floor muscle strength in order to help reduce urinary incontinence.    Time 4    Period Weeks    Status Partially Met    Target Date 10/20/21               PT Long Term Goals - 10/21/21 1146       PT LONG TERM GOAL #1   Title Pt will be independent with advanced HEP in 8 weeks.    Time 8    Period Weeks    Status Partially Met    Target Date 11/17/21      PT LONG TERM GOAL #2   Title Pt will report no leaks with lifting, walking, laughing, coughing, sneezing in order to improve comfort with interpersonal relationships and community activities in 8 weeks.    Time 8    Period Weeks    Status Partially Met    Target Date  11/17/21      PT LONG TERM GOAL #3   Title Pt will be able to achieve 2-3 hour voiding windows in order to have better work and home life balance.    Time 8    Period Weeks    Status Partially Met    Target Date 11/17/21      PT LONG TERM GOAL #4   Title Pt will report no more than 1-2 episodes of urinary incontinence a week.    Time 8    Period Weeks    Status Partially Met    Target Date 11/17/21                   Plan - 11/11/21 1120     Clinical Impression Statement Pt doing really well with improving urinary incontinence demosntrated by being able to walk with full bladder and no leakage. We disucssed that leaking with sneezing will likely improve as pelivc floor continues to get stronger, but she was given poise ipressa sizing kit for extra support if she feels like allergies are bad or she has a cold. Pt education also performed on that if poise impressa are helpful, she may want to consider pessary fitting. Good tolerance to all exercise progressions with exception of pallof press due to increased Lt elbow pain (left off of HEP). She will continue to benefit from skilled PT intervention in order to progress pelvic floor and functional strengthening.    PT Treatment/Interventions ADLs/Self Care Home Management;Biofeedback;Cryotherapy;Electrical Stimulation;Moist Heat;Neuromuscular re-education;Therapeutic exercise;Therapeutic activities;Patient/family education;Manual techniques;Scar mobilization;Spinal Manipulations    PT Next Visit Plan Progress functional strengthening to pt tolerance; progress pelvic floor strengthening in functional positions.    PT Home Exercise Plan [id number]    Consulted and Agree with Plan of Care Patient             Patient will benefit from skilled therapeutic intervention in order to improve the following deficits and impairments:  Decreased coordination, Decreased range of  motion, Impaired tone, Decreased endurance, Decreased activity  tolerance, Decreased scar mobility, Decreased mobility, Decreased strength  Visit Diagnosis: Muscle weakness (generalized)  Unspecified lack of coordination     Problem List Patient Active Problem List   Diagnosis Date Noted   Chest pain 10/25/2018   Atherosclerosis 05/22/2017   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[place]) 04/26/2017   Family history of malignant neoplasm of ovary 05/09/2014   Family history of malignant neoplasm of breast 05/09/2014   Allergic rhinitis    Edema    Back pain    Insomnia    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 Patient goals were partially met. Patient is being discharged due to not returning since the last visit.

## 2021-11-20 ENCOUNTER — Encounter (HOSPITAL_BASED_OUTPATIENT_CLINIC_OR_DEPARTMENT_OTHER): Payer: Self-pay | Admitting: Obstetrics & Gynecology

## 2021-11-20 ENCOUNTER — Other Ambulatory Visit: Payer: Self-pay

## 2021-11-20 ENCOUNTER — Ambulatory Visit (INDEPENDENT_AMBULATORY_CARE_PROVIDER_SITE_OTHER): Payer: No Typology Code available for payment source | Admitting: Obstetrics & Gynecology

## 2021-11-20 VITALS — BP 117/73 | HR 65 | Ht 62.0 in | Wt 166.6 lb

## 2021-11-20 DIAGNOSIS — Z0000 Encounter for general adult medical examination without abnormal findings: Secondary | ICD-10-CM

## 2021-11-20 DIAGNOSIS — R110 Nausea: Secondary | ICD-10-CM

## 2021-11-20 DIAGNOSIS — Z01419 Encounter for gynecological examination (general) (routine) without abnormal findings: Secondary | ICD-10-CM

## 2021-11-20 DIAGNOSIS — G43009 Migraine without aura, not intractable, without status migrainosus: Secondary | ICD-10-CM | POA: Diagnosis not present

## 2021-11-20 DIAGNOSIS — Z8041 Family history of malignant neoplasm of ovary: Secondary | ICD-10-CM

## 2021-11-20 DIAGNOSIS — Z9189 Other specified personal risk factors, not elsewhere classified: Secondary | ICD-10-CM

## 2021-11-20 DIAGNOSIS — G4700 Insomnia, unspecified: Secondary | ICD-10-CM

## 2021-11-20 MED ORDER — SUMATRIPTAN SUCCINATE 100 MG PO TABS: ORAL_TABLET | ORAL | 3 refills | Status: AC

## 2021-11-20 MED ORDER — ONDANSETRON 8 MG PO TBDP: 8.0000 mg | ORAL_TABLET | Freq: Three times a day (TID) | ORAL | 1 refills | Status: AC | PRN

## 2021-11-20 NOTE — Progress Notes (Signed)
56 y.o. G2P2 Married White or Caucasian female here for annual exam.  Has lifetime risk of breast cancer that is 32.6%.  Desires to proceed with MRI again this year.   ? ?Denies vaginal bleeding. ? ?Has established care with new PCP office. ? ?No LMP recorded. (Menstrual status: Irregular Periods).          ?Sexually active: Yes.    ?The current method of family planning is vasectomy.    ?Exercising: yes ?Smoker:  no ? ?Health Maintenance: ?Pap:  11/14/2020 Negative ?History of abnormal Pap:  yes ?MMG:  07/07/2021 Negative ?Colonoscopy:  03/28/2017, follow up 10 years ?BMD: guidelines reviewed ?Screening Labs: establishing care ? ? reports that she has quit smoking. She has never used smokeless tobacco. She reports that she does not drink alcohol and does not use drugs. ? ?Past Medical History:  ?Diagnosis Date  ? Allergic rhinitis   ? right leg swelling  ? Back pain   ? Back pain   ? Chest pain   ? Edema   ? Heart burn   ? Insomnia   ? Racing heart beat   ? ? ?Past Surgical History:  ?Procedure Laterality Date  ? BREAST SURGERY  10/2014  ? reduction done by Dr. Janae Sauce  ? WISDOM TOOTH EXTRACTION    ? ? ?Current Outpatient Medications  ?Medication Sig Dispense Refill  ? ALPRAZolam (XANAX) 0.5 MG tablet as needed.     ? Cholecalciferol (VITAMIN D PO) Take 10,000 Units by mouth daily.     ? Menaquinone-7 (VITAMIN K2 PO) Take by mouth.    ? Nutritional Supplements (DHEA PO) Take by mouth.    ? thyroid (ARMOUR) 90 MG tablet Take 90 mg by mouth daily.    ? vitamin B-12 (CYANOCOBALAMIN) 1000 MCG tablet Take by mouth.    ? zolpidem (AMBIEN) 5 MG tablet Take 1 tablet (5 mg total) by mouth at bedtime. 30 tablet 2  ? ondansetron (ZOFRAN-ODT) 8 MG disintegrating tablet Take 1 tablet (8 mg total) by mouth 3 (three) times daily as needed. Nausea and vomiting 20 tablet 1  ? SUMAtriptan (IMITREX) 100 MG tablet TAKE 1 TABLET BY MOUTH AT ONSET OF MIGRAINE. MAY REPEAT IN 2 HRS. MAX 2TABS/24HRS 11 tablet 3  ? ?No current  facility-administered medications for this visit.  ? ? ?Family History  ?Problem Relation Age of Onset  ? Cancer Mother 34  ?     breast  ? Atrial fibrillation Mother   ? Kidney disease Mother   ? Parkinson's disease Mother   ? Dementia Mother   ? Heart attack Father   ? Heart attack Maternal Grandfather   ? Heart attack Paternal Grandfather   ? Cancer Paternal Aunt 55  ?     ovarian  ? Cancer Other   ?     paternal great aunt (through [facility]) with ovarian cancer and several 1st cousins once removed (also through [facility]) with breast cancer  ? Multiple sclerosis Brother   ? ? ?Review of Systems  ?All other systems reviewed and are negative. ? ?Exam:   ?BP 117/73 (BP Location: Left Arm, Patient Position: Sitting, Cuff Size: Normal)   Pulse 65   Ht 5\' 2"  (1.575 m) Comment: reported  Wt 166 lb 9.6 oz (75.6 kg)   BMI 30.47 kg/m?   Height: 5\' 2"  (157.5 cm) (reported) ? ?General appearance: alert, cooperative and appears stated age ?Head: Normocephalic, without obvious abnormality, atraumatic ?Neck: no adenopathy, supple, symmetrical, trachea midline and thyroid  normal to inspection and palpation ?Lungs: clear to auscultation bilaterally ?Breasts: normal appearance, no masses or tenderness ?Heart: regular rate and rhythm ?Abdomen: soft, non-tender; bowel sounds normal; no masses,  no organomegaly ?Extremities: extremities normal, atraumatic, no cyanosis or edema ?Skin: Skin color, texture, turgor normal. No rashes or lesions ?Lymph nodes: Cervical, supraclavicular, and axillary nodes normal. ?No abnormal inguinal nodes palpated ?Neurologic: Grossly normal ? ? ?Pelvic: External genitalia:  no lesions ?             Urethra:  normal appearing urethra with no masses, tenderness or lesions ?             Bartholins and Skenes: normal    ?             Vagina: normal appearing vagina with normal color and no discharge, no lesions ?             Cervix: no lesions ?             Pap taken: No. ?Bimanual Exam:  Uterus:  normal size,  contour, position, consistency, mobility, non-tender ?             Adnexa: normal adnexa and no mass, fullness, tenderness ?              Rectovaginal: Confirms ?              Anus:  normal sphincter tone, no lesions ? ?Chaperone, Ina Homes, CMA, was present for exam. ? ?Assessment/Plan: ?1. Well woman exam with routine gynecological exam ?- pap neg with neg HR HPV 11/2020 ?- MMG 07/2021 ?- Colonosccopy 03/28/2017, follow up 10 years ?- lab work ordered at future labs ? ?2. Migraine without aura and without status migrainosus, not intractable ?- SUMAtriptan (IMITREX) 100 MG tablet; TAKE 1 TABLET BY MOUTH AT ONSET OF MIGRAINE. MAY REPEAT IN 2 HRS. MAX 2TABS/24HRS  Dispense: 11 tablet; Refill: 3 ?- ondansetron (ZOFRAN-ODT) 8 MG disintegrating tablet; Take 1 tablet (8 mg total) by mouth 3 (three) times daily as needed. Nausea and vomiting  Dispense: 20 tablet; Refill: 1 ? ?3. Blood tests for routine general physical examination ?- Thyroid Panel With TSH ?- Lipid panel ?- VITAMIN D 25 Hydroxy (Vit-D Deficiency, Fractures) ? ?4. Increase lifetime risk breast cancer ?- plan breast MRI this summer ? ?5. Family history of malignant neoplasm of ovary ? ?6. Insomnia, unspecified type ? ? ?

## 2021-11-24 ENCOUNTER — Other Ambulatory Visit: Payer: Self-pay | Admitting: Obstetrics & Gynecology

## 2021-11-25 LAB — LIPID PANEL
Chol/HDL Ratio: 3.3 ratio (ref 0.0–4.4)
Cholesterol, Total: 215 mg/dL — ABNORMAL HIGH (ref 100–199)
HDL: 66 mg/dL
LDL Chol Calc (NIH): 129 mg/dL — ABNORMAL HIGH (ref 0–99)
Triglycerides: 111 mg/dL (ref 0–149)
VLDL Cholesterol Cal: 20 mg/dL (ref 5–40)

## 2021-11-25 LAB — HEMOGLOBIN A1C
Est. average glucose Bld gHb Est-mCnc: 111 mg/dL
Hgb A1c MFr Bld: 5.5 % (ref 4.8–5.6)

## 2021-11-25 LAB — THYROID PANEL WITH TSH
Free Thyroxine Index: 1.7 (ref 1.2–4.9)
T3 Uptake Ratio: 26 % (ref 24–39)
T4, Total: 6.4 ug/dL (ref 4.5–12.0)
TSH: 0.185 u[IU]/mL — ABNORMAL LOW (ref 0.450–4.500)

## 2021-11-25 LAB — VITAMIN D 25 HYDROXY (VIT D DEFICIENCY, FRACTURES): Vit D, 25-Hydroxy: 42.3 ng/mL (ref 30.0–100.0)

## 2021-12-09 ENCOUNTER — Ambulatory Visit: Payer: No Typology Code available for payment source

## 2021-12-17 ENCOUNTER — Ambulatory Visit (INDEPENDENT_AMBULATORY_CARE_PROVIDER_SITE_OTHER): Payer: No Typology Code available for payment source | Admitting: Orthopaedic Surgery

## 2021-12-17 ENCOUNTER — Encounter: Payer: Self-pay | Admitting: Orthopaedic Surgery

## 2021-12-17 DIAGNOSIS — M25512 Pain in left shoulder: Secondary | ICD-10-CM | POA: Diagnosis not present

## 2021-12-17 NOTE — Progress Notes (Signed)
? ?Office Visit Note ?  ?Charlotte Velazquez: Charlotte Velazquez           ?Date of Birth: May 27, 1966           ?MRN: [id number] ?Visit Date: 12/17/2021 ?             ?Requested by: No referring provider defined for this encounter. ?PCP: Charlotte Velazquez, No Pcp Per (Inactive) ? ? ?Assessment & Plan: ?Visit Diagnoses:  ?1. Acute pain of left shoulder   ? ? ?Plan: Charlotte Velazquez sustained injury to her right hand, left shoulder and elbow via an on-the-job injury on November 26, 2021.  She has been seeing Dr. Amedeo Plenty for her right dominant wrist pain and is scheduled to have an MRI scan.  She initially experienced some left shoulder pain but after a day or 2 it seemed to resolve only to have it recur.  She did not file at with Workmen's Comp. but notes that she is definitely having a problem.  She has never had a problem with her shoulder in the past.  On this occasion she notes her shoulder was not swollen or black and blue but now she is reached a point where she is having trouble reaching overhead and even sleeping on that side.  She has difficulty applying or closed because of the discomfort.  She has not had any numbness or tingling.  Her exam was positive for empty can testing and impingement testing.  Good grip and release and at the excellent strength.  The skin was intact.  She was somewhat hesitant to proceed with any diagnostic testing.  She certainly could have a small rotator cuff tear or possibly just a soft tissue injury but I had suggested she might want to check with Workmen's Comp. regarding coverage.  She did have initial pain that seem to be resolved only to have it recur.  It would be worthwhile to see her again in about 3 weeks or about 5 to 6 weeks from the time of the injury further evaluation.  At that point she might need further intervention with either x-rays or an MRI scan.  In the meantime I want her to keep the shoulder moving but avoid activities that create pain.  She does have a Voltaren gel which might be very  helpful. ? ?Follow-Up Instructions: Return in about 3 weeks (around 01/07/2022).  ? ?Orders:  ?No orders of the defined types were placed in this encounter. ? ?No orders of the defined types were placed in this encounter. ? ? ? ? Procedures: ?No procedures performed ? ? ?Clinical Data: ?No additional findings. ? ? ?Subjective: ?Chief Complaint  ?Charlotte Velazquez presents with  ? Left Shoulder - Pain  ?Charlotte Velazquez presents today for left shoulder pain. She said that she was trying on clothes Saturday and felt discomfort. She was unable to move her arm the followin day. She has noticed some improvement, but still has limited range of motion. Her pain is located at the anterior aspect. She has taken Aleve as needed. She is right hand dominant.  ? ?HPI ? ?Review of Systems ? ? ?Objective: ?Vital Signs: There were no vitals taken for this visit. ? ?Physical Exam ?Constitutional:   ?   Appearance: She is well-developed.  ?Eyes:  ?   Pupils: Pupils are equal, round, and reactive to light.  ?Pulmonary:  ?   Effort: Pulmonary effort is normal.  ?Skin: ?   General: Skin is warm and dry.  ?Neurological:  ?   Mental Status:  She is alert and oriented to person, place, and time.  ?Psychiatric:     ?   Behavior: Behavior normal.  ? ? ?Ortho Exam awake alert and oriented x3.  Comfortable sitting.  No acute distress.  Wearing a volar wrist splint on her right dominant hand.  She was able to place her left shoulder overhead but with a circuitous arc of motion.  She had positive pain with impingement testing on the extreme of external rotation.  There was a positive empty can test with discomfort along the proximal triceps.  Speeds sign was also uncomfortable but posteriorly and not anteriorly.  There was no pain at the [facility] joint or over the glenohumeral joint.  There was no pain in the anterior or lateral subacromial region.  No obvious edema.  Good grip and release and excellent strength.  Did not appear to have any elbow pain today ? ?Specialty  Comments:  ?No specialty comments available. ? ?Imaging: ?No results found. ? ? ?PMFS History: ?Charlotte Velazquez Active Problem List  ? Diagnosis Date Noted  ? Pain in left shoulder 12/17/2021  ? Increased risk of breast cancer 11/20/2021  ? Chest pain 10/25/2018  ? Atherosclerosis 05/22/2017  ? 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 Family history of malignant neoplasm of ovary 05/09/2014  ? Family history of malignant neoplasm of breast 05/09/2014  ? Allergic rhinitis   ? Edema   ? Back pain   ? Insomnia   ? 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 ?Past Medical History:  ?Diagnosis Date  ? Allergic rhinitis   ? right leg swelling  ? Back pain   ? Back pain   ? Chest pain   ? Edema   ? Heart burn   ? Insomnia   ? Racing heart beat   ?  ?Family History  ?Problem Relation Age of Onset  ? Cancer Mother 84  ?     breast  ? Atrial fibrillation Mother   ? Kidney disease Mother   ? Parkinson's disease Mother   ? Dementia Mother   ? Heart attack Father   ? Heart attack Maternal Grandfather   ? Heart attack Paternal Grandfather   ? Cancer Paternal Aunt 68  ?     ovarian  ? Cancer Other   ?     paternal great aunt (through [facility]) with ovarian cancer and several 1st cousins once removed (also through [facility]) with breast cancer  ? Multiple sclerosis Brother   ?  ?Past Surgical History:  ?Procedure Laterality Date  ? BREAST SURGERY  10/2014  ? reduction done by Dr. Emeline Darling  ? WISDOM TOOTH EXTRACTION    ? ?Social History  ? ?Occupational History  ? Not on file  ?Tobacco Use  ? Smoking status: Former  ? Smokeless tobacco: Never  ?Vaping Use  ? Vaping Use: Never used  ?Substance and Sexual Activity  ? Alcohol use: No  ? Drug use: No  ? Sexual activity: Yes  ?  Birth control/protection: Surgical, Pill  ?  Comment: vastectomy  ? ? ? ? ? ? ?

## 2022-01-14 ENCOUNTER — Ambulatory Visit: Payer: No Typology Code available for payment source | Admitting: Orthopaedic Surgery

## 2022-04-06 ENCOUNTER — Other Ambulatory Visit (HOSPITAL_BASED_OUTPATIENT_CLINIC_OR_DEPARTMENT_OTHER): Payer: Self-pay | Admitting: Obstetrics & Gynecology

## 2022-04-06 DIAGNOSIS — Z9189 Other specified personal risk factors, not elsewhere classified: Secondary | ICD-10-CM

## 2022-05-04 ENCOUNTER — Encounter (HOSPITAL_BASED_OUTPATIENT_CLINIC_OR_DEPARTMENT_OTHER): Payer: Self-pay | Admitting: Obstetrics & Gynecology

## 2022-06-28 ENCOUNTER — Encounter: Payer: Self-pay | Admitting: *Deleted

## 2022-09-01 ENCOUNTER — Encounter (HOSPITAL_BASED_OUTPATIENT_CLINIC_OR_DEPARTMENT_OTHER): Payer: Self-pay | Admitting: Obstetrics & Gynecology

## 2022-11-25 ENCOUNTER — Other Ambulatory Visit (HOSPITAL_BASED_OUTPATIENT_CLINIC_OR_DEPARTMENT_OTHER): Payer: No Typology Code available for payment source

## 2022-11-26 ENCOUNTER — Ambulatory Visit (HOSPITAL_BASED_OUTPATIENT_CLINIC_OR_DEPARTMENT_OTHER): Payer: No Typology Code available for payment source | Admitting: Obstetrics & Gynecology

## 2022-12-13 ENCOUNTER — Other Ambulatory Visit (HOSPITAL_BASED_OUTPATIENT_CLINIC_OR_DEPARTMENT_OTHER): Payer: Self-pay | Admitting: Obstetrics & Gynecology

## 2022-12-13 ENCOUNTER — Ambulatory Visit (HOSPITAL_BASED_OUTPATIENT_CLINIC_OR_DEPARTMENT_OTHER): Payer: No Typology Code available for payment source

## 2022-12-13 DIAGNOSIS — E785 Hyperlipidemia, unspecified: Secondary | ICD-10-CM

## 2022-12-13 DIAGNOSIS — R7989 Other specified abnormal findings of blood chemistry: Secondary | ICD-10-CM

## 2022-12-13 DIAGNOSIS — E669 Obesity, unspecified: Secondary | ICD-10-CM

## 2022-12-14 LAB — COMPREHENSIVE METABOLIC PANEL
ALT: 20 IU/L (ref 0–32)
AST: 19 IU/L (ref 0–40)
Albumin/Globulin Ratio: 1.6 (ref 1.2–2.2)
Albumin: 4.1 g/dL (ref 3.8–4.9)
Alkaline Phosphatase: 89 IU/L (ref 44–121)
BUN/Creatinine Ratio: 14 (ref 9–23)
BUN: 12 mg/dL (ref 6–24)
Bilirubin Total: 0.3 mg/dL (ref 0.0–1.2)
CO2: 24 mmol/L (ref 20–29)
Calcium: 9.3 mg/dL (ref 8.7–10.2)
Chloride: 103 mmol/L (ref 96–106)
Creatinine, Ser: 0.84 mg/dL (ref 0.57–1.00)
Globulin, Total: 2.6 g/dL (ref 1.5–4.5)
Glucose: 88 mg/dL (ref 70–99)
Potassium: 4.1 mmol/L (ref 3.5–5.2)
Sodium: 139 mmol/L (ref 134–144)
Total Protein: 6.7 g/dL (ref 6.0–8.5)
eGFR: 83 mL/min/{1.73_m2} (ref >59)

## 2022-12-14 LAB — LIPID PANEL
Chol/HDL Ratio: 3.7 ratio (ref 0.0–4.4)
Cholesterol, Total: 233 mg/dL — ABNORMAL HIGH (ref 100–199)
HDL: 63 mg/dL (ref >39)
LDL Chol Calc (NIH): 148 mg/dL — ABNORMAL HIGH (ref 0–99)
Triglycerides: 127 mg/dL (ref 0–149)
VLDL Cholesterol Cal: 22 mg/dL (ref 5–40)

## 2022-12-14 LAB — VITAMIN D 25 HYDROXY (VIT D DEFICIENCY, FRACTURES): Vit D, 25-Hydroxy: 39.2 ng/mL (ref 30.0–100.0)

## 2022-12-14 LAB — THYROID PANEL WITH TSH
Free Thyroxine Index: 1.4 (ref 1.2–4.9)
T3 Uptake Ratio: 25 % (ref 24–39)
T4, Total: 5.4 ug/dL (ref 4.5–12.0)
TSH: 0.256 u[IU]/mL — ABNORMAL LOW (ref 0.450–4.500)

## 2022-12-14 LAB — HEMOGLOBIN A1C
Est. average glucose Bld gHb Est-mCnc: 111 mg/dL
Hgb A1c MFr Bld: 5.5 % (ref 4.8–5.6)

## 2023-02-04 ENCOUNTER — Ambulatory Visit (INDEPENDENT_AMBULATORY_CARE_PROVIDER_SITE_OTHER): Payer: Self-pay | Admitting: Obstetrics & Gynecology

## 2023-02-04 ENCOUNTER — Encounter (HOSPITAL_BASED_OUTPATIENT_CLINIC_OR_DEPARTMENT_OTHER): Payer: Self-pay | Admitting: Obstetrics & Gynecology

## 2023-02-04 VITALS — BP 146/81 | HR 68 | Ht 62.0 in | Wt 163.8 lb

## 2023-02-04 DIAGNOSIS — N951 Menopausal and female climacteric states: Secondary | ICD-10-CM

## 2023-02-04 DIAGNOSIS — G4709 Other insomnia: Secondary | ICD-10-CM

## 2023-02-04 DIAGNOSIS — Z01419 Encounter for gynecological examination (general) (routine) without abnormal findings: Secondary | ICD-10-CM

## 2023-02-04 DIAGNOSIS — G43009 Migraine without aura, not intractable, without status migrainosus: Secondary | ICD-10-CM

## 2023-02-04 DIAGNOSIS — R232 Flushing: Secondary | ICD-10-CM

## 2023-02-04 DIAGNOSIS — Z9189 Other specified personal risk factors, not elsewhere classified: Secondary | ICD-10-CM

## 2023-02-04 MED ORDER — GABAPENTIN 100 MG PO CAPS: ORAL_CAPSULE | ORAL | 0 refills | Status: DC

## 2023-02-04 NOTE — Progress Notes (Signed)
57 y.o. G2P2 Married White or Caucasian female here for annual exam.  BP is mildly elevated today.  She took a phentermine tablet today.  This was written by PCP.   Did not do mammogram last year.    Denies vaginal bleeding in 6-8 months.    Having some reflux and some mid epigastric pain.    No LMP recorded. (Menstrual status: Irregular Periods).          Sexually active: Yes.    The current method of family planning is vasectomy.    Smoker:  no  Health Maintenance: Pap:  11/14/2020 Negative History of abnormal Pap:  remote hx MMG:  07/07/2021, pt is aware, Breast MRI 2022 Colonoscopy:  03/28/2017, follow up 10 years Screening Labs: done April   reports that she has quit smoking. She has never used smokeless tobacco. She reports that she does not drink alcohol and does not use drugs.  Past Medical History:  Diagnosis Date   Allergic rhinitis    right leg swelling   Back pain    Back pain    Chest pain    Edema    Heart burn    Insomnia    Racing heart beat     Past Surgical History:  Procedure Laterality Date   BREAST SURGERY  10/2014   reduction done by Dr. Janae Sauce   WISDOM TOOTH EXTRACTION      Current Outpatient Medications  Medication Sig Dispense Refill   ALPRAZolam (XANAX) 0.5 MG tablet as needed.      Cholecalciferol (VITAMIN D PO) Take 10,000 Units by mouth daily.      Menaquinone-7 (VITAMIN K2 PO) Take by mouth.     Nutritional Supplements (DHEA PO) Take by mouth.     ondansetron (ZOFRAN-ODT) 8 MG disintegrating tablet Take 1 tablet (8 mg total) by mouth 3 (three) times daily as needed. Nausea and vomiting 20 tablet 1   SUMAtriptan (IMITREX) 100 MG tablet TAKE 1 TABLET BY MOUTH AT ONSET OF MIGRAINE. MAY REPEAT IN 2 HRS. MAX 2TABS/24HRS 11 tablet 3   vitamin B-12 (CYANOCOBALAMIN) 1000 MCG tablet Take by mouth.     zolpidem (AMBIEN) 5 MG tablet Take 1 tablet (5 mg total) by mouth at bedtime. 30 tablet 2   No current facility-administered medications  for this visit.    Family History  Problem Relation Age of Onset   Cancer Mother 94       breast   Atrial fibrillation Mother    Kidney disease Mother    Parkinson's disease Mother    Dementia Mother    Heart attack Father    Heart attack Maternal Grandfather    Heart attack Paternal Grandfather    Cancer Paternal Aunt 28       ovarian   Cancer Other        paternal great aunt (through [facility]) with ovarian cancer and several 1st cousins once removed (also through [facility]) with breast cancer   Multiple sclerosis Brother     ROS: Constitutional: negative Genitourinary:negative  Exam:   BP (!) 146/81 (BP Location: Left Arm, Patient Position: Sitting, Cuff Size: Large)   Pulse 68   Ht 5\' 2"  (1.575 m) Comment: Reported  Wt 163 lb 12.8 oz (74.3 kg)   BMI 29.96 kg/m   Height: 5\' 2"  (157.5 cm) (Reported)  General appearance: alert, cooperative and appears stated age Head: Normocephalic, without obvious abnormality, atraumatic Neck: no adenopathy, supple, symmetrical, trachea midline and thyroid normal to inspection and  palpation Lungs: clear to auscultation bilaterally Breasts: normal appearance, no masses or tenderness Heart: regular rate and rhythm Abdomen: soft, non-tender; bowel sounds normal; no masses,  no organomegaly Extremities: extremities normal, atraumatic, no cyanosis or edema Skin: Skin color, texture, turgor normal. No rashes or lesions Lymph nodes: Cervical, supraclavicular, and axillary nodes normal. No abnormal inguinal nodes palpated Neurologic: Grossly normal   Pelvic: External genitalia:  no lesions              Urethra:  normal appearing urethra with no masses, tenderness or lesions              Bartholins and Skenes: normal                 Vagina: normal appearing vagina with normal color and no discharge, no lesions              Cervix: no lesions              Pap taken: No. Bimanual Exam:  Uterus:  normal size, contour, position, consistency, mobility,  non-tender              Adnexa: normal adnexa and no mass, fullness, tenderness               Rectovaginal: Confirms               Anus:  normal sphincter tone, no lesions  Chaperone, Ina Homes, CMA, was present for exam.  Assessment/Plan: 1. Well woman exam with routine gynecological exam - Pap smear 2022 with neg HR HPV.  Will repeat next year. - Mammogram overdue.  Pt aware. - Colonoscopy 2018 - Bone mineral density not indicated yet - lab work done 12/2022.  She is seeing PCP today. - vaccines reviewed/updated  2. Increased risk of breast cancer - MMG overdue - did MRI in 2021.  She and I discussed repeating this year.   3. Other insomnia - will discuss with PCP  4. Migraine without aura and without status migrainosus, not intractable - does not need PCP  5. Perimenopausal - advised to call with any bleeding if after a year

## 2023-06-09 IMAGING — CT CT RENAL STONE PROTOCOL
2 of 4 series · 17 of 46 positions shown, 19 images · non-contrast
Comparison: None.

CLINICAL DATA: Right flank pain for 2 months.



[Series 2: stone full · axial · 0.80mm/px · z∈[-393,+12]mm · 14 of 89 slices shown, 16 images]
[im 4/89  soft-tissue]
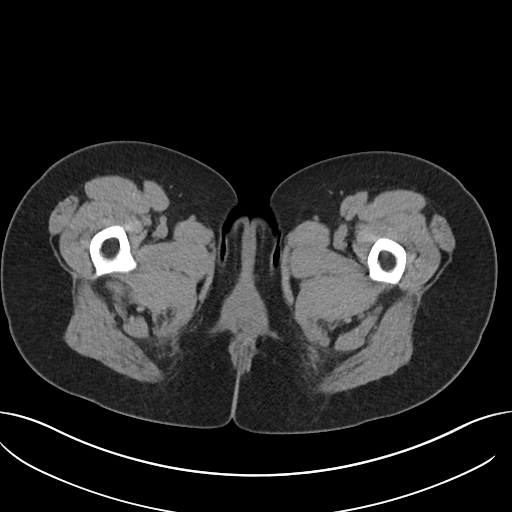
[im 4/89  bone]
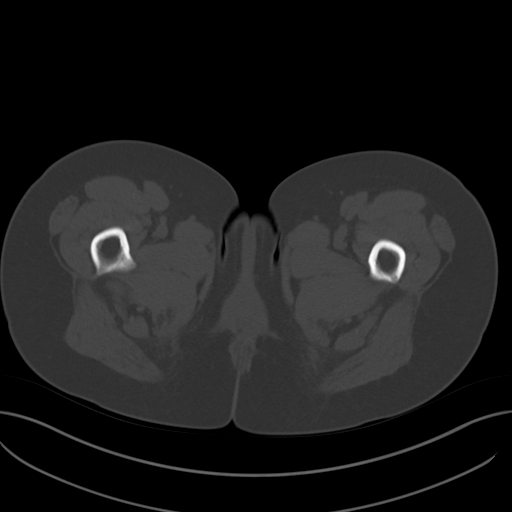
[im 11/89  soft-tissue]
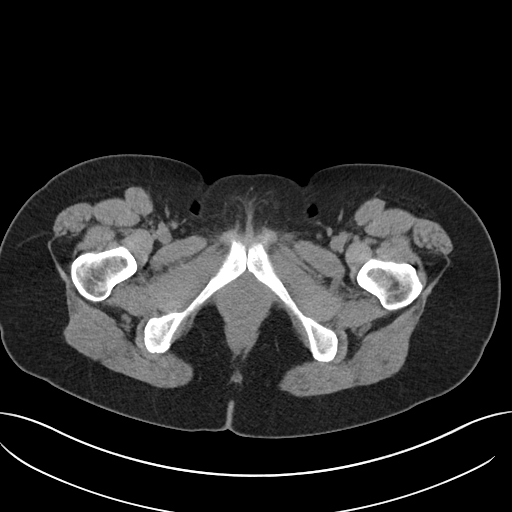
[im 17/89  soft-tissue]
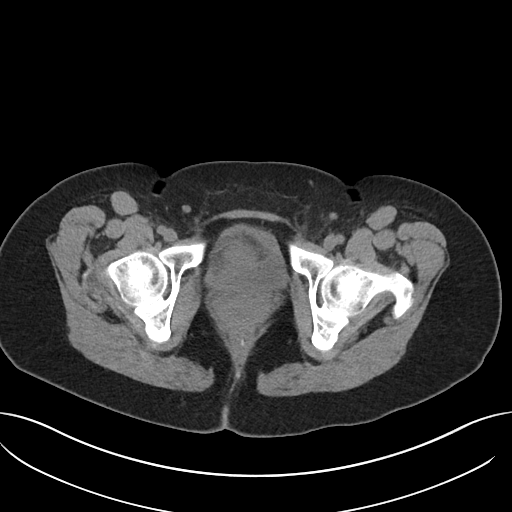
[im 24/89  soft-tissue]
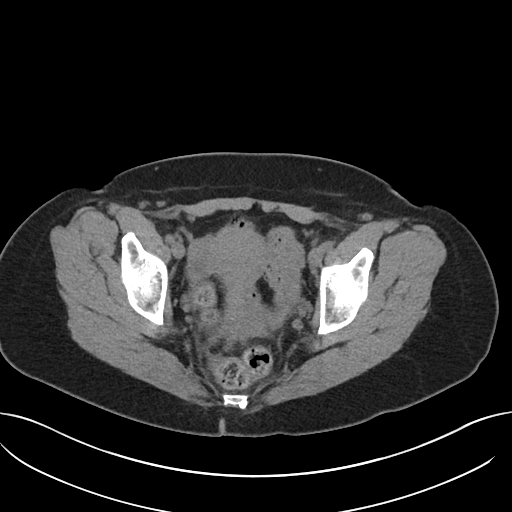
[im 31/89  soft-tissue]
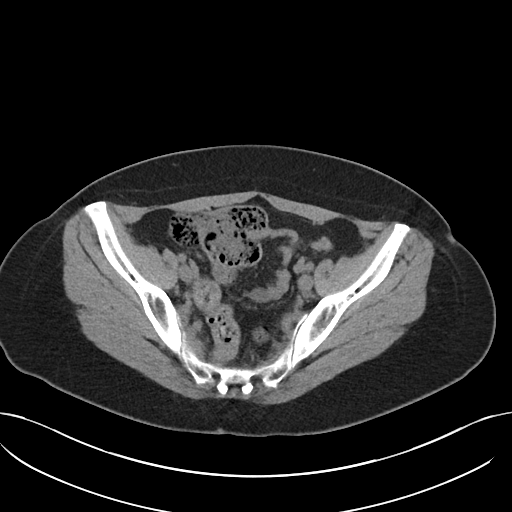
[im 34/89  soft-tissue]
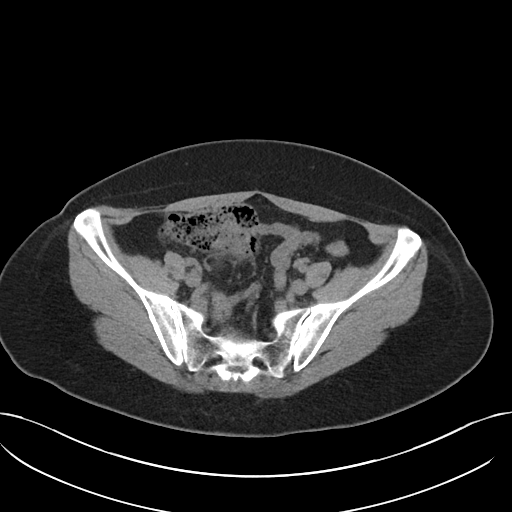
[im 41/89  soft-tissue]
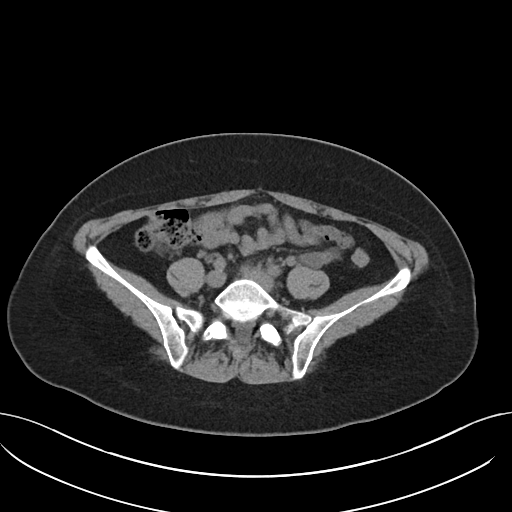
[im 48/89  soft-tissue]
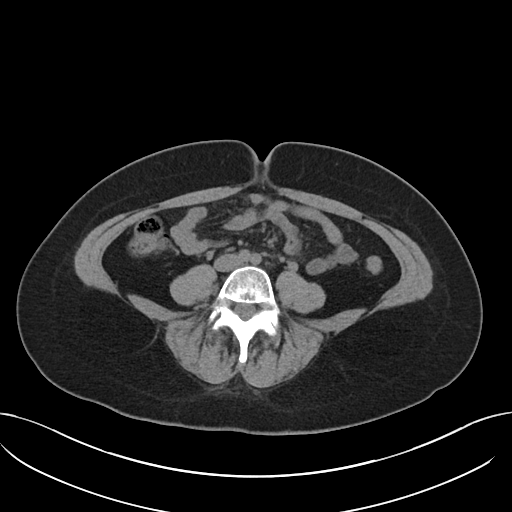
[im 55/89  soft-tissue]
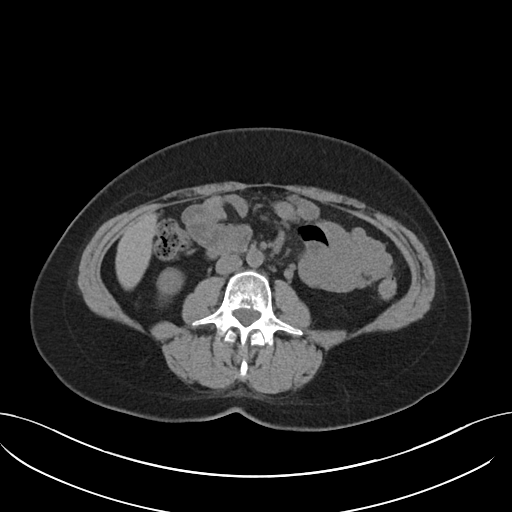
[im 55/89  bone]
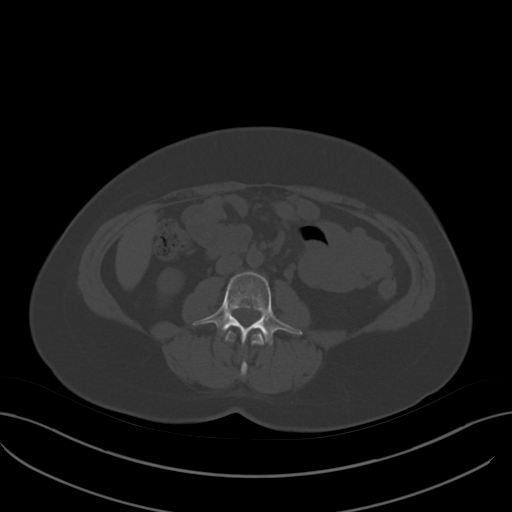
[im 58/89  soft-tissue]
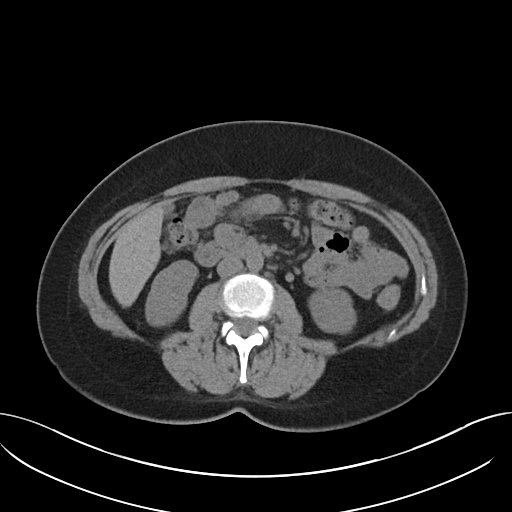
[im 65/89  soft-tissue]
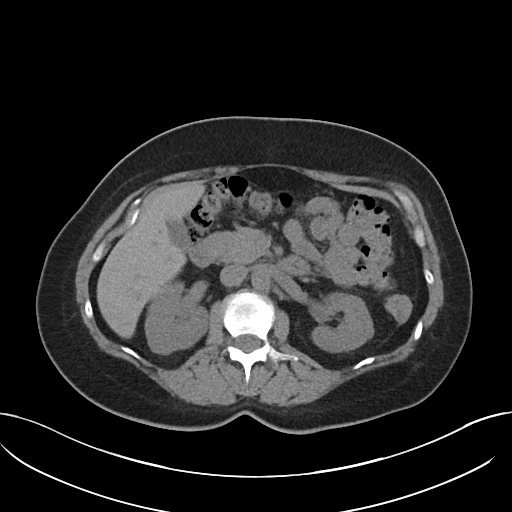
[im 72/89  soft-tissue]
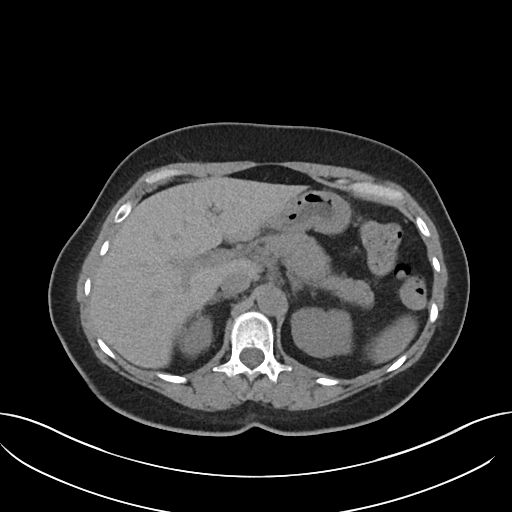
[im 78/89  soft-tissue]
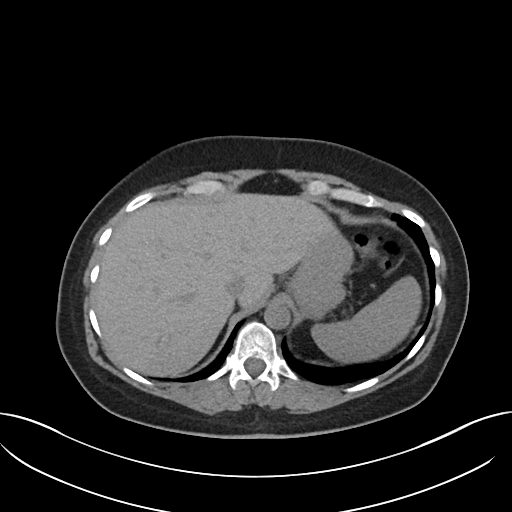
[im 85/89  soft-tissue]
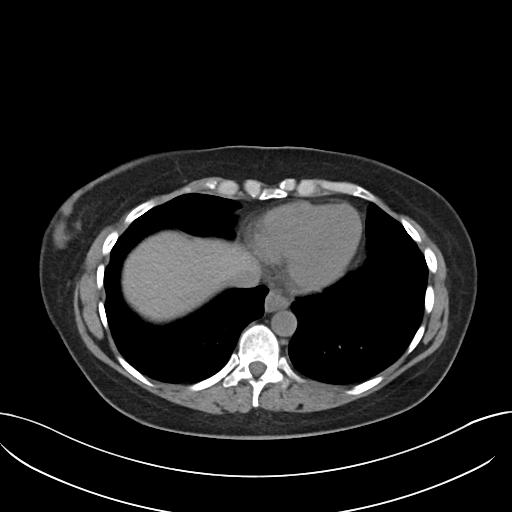

[Series 5: coronal · coronal · 0.85mm/px · 3 of 89 slices shown]
[im 30/89  soft-tissue]
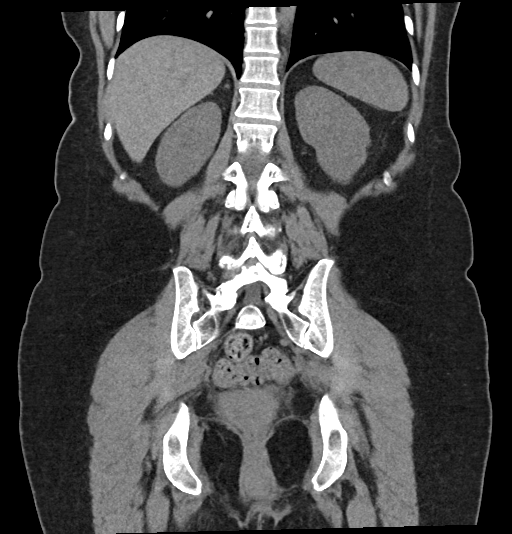
[im 40/89  soft-tissue]
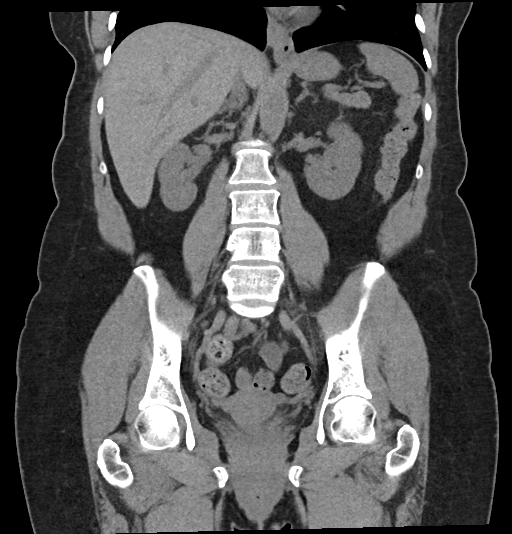
[im 49/89  soft-tissue]
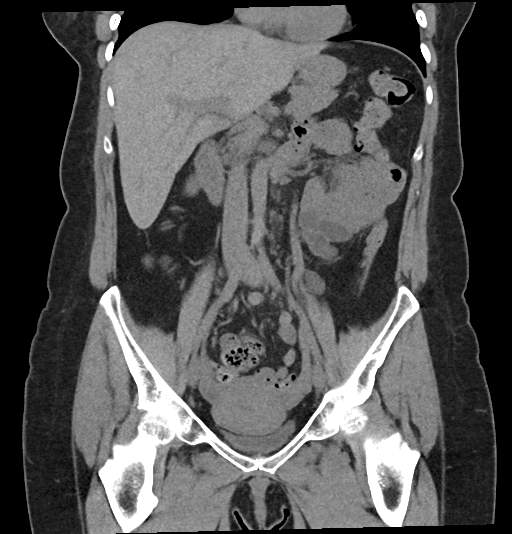

[17 of 46 positions shown; findings below may reference images not displayed]

FINDINGS: Lower chest: No acute abnormality.

Hepatobiliary: No focal hepatic abnormality. Gallbladder
unremarkable.

Pancreas: No focal abnormality or ductal dilatation.

Spleen: No focal abnormality.  Normal size.

Adrenals/Urinary Tract: No adrenal abnormality. No focal renal
abnormality. No stones or hydronephrosis. Urinary bladder is
unremarkable.

Stomach/Bowel: Moderate stool burden throughout the colon. Stomach,
large and small bowel grossly unremarkable.

Vascular/Lymphatic: No evidence of aneurysm or adenopathy.

Reproductive: Uterus and adnexa unremarkable.  No mass.

Other: No free fluid or free air.

Musculoskeletal: No acute bony abnormality.
IMPRESSION: No renal or ureteral stones.  No hydronephrosis.

Moderate stool burden.

No acute findings.

## 2023-06-16 ENCOUNTER — Encounter (HOSPITAL_BASED_OUTPATIENT_CLINIC_OR_DEPARTMENT_OTHER): Payer: Self-pay | Admitting: Obstetrics & Gynecology

## 2023-06-16 DIAGNOSIS — Z1231 Encounter for screening mammogram for malignant neoplasm of breast: Secondary | ICD-10-CM

## 2023-06-29 ENCOUNTER — Encounter (HOSPITAL_BASED_OUTPATIENT_CLINIC_OR_DEPARTMENT_OTHER): Payer: Self-pay | Admitting: Obstetrics & Gynecology

## 2023-06-29 ENCOUNTER — Encounter (HOSPITAL_BASED_OUTPATIENT_CLINIC_OR_DEPARTMENT_OTHER): Payer: Self-pay | Admitting: Certified Nurse Midwife

## 2023-06-29 ENCOUNTER — Ambulatory Visit (INDEPENDENT_AMBULATORY_CARE_PROVIDER_SITE_OTHER): Payer: No Typology Code available for payment source | Admitting: Certified Nurse Midwife

## 2023-06-29 VITALS — BP 126/90 | HR 82 | Ht 62.0 in | Wt 163.6 lb

## 2023-06-29 DIAGNOSIS — N951 Menopausal and female climacteric states: Secondary | ICD-10-CM

## 2023-06-29 DIAGNOSIS — E079 Disorder of thyroid, unspecified: Secondary | ICD-10-CM | POA: Diagnosis not present

## 2023-06-29 DIAGNOSIS — G4709 Other insomnia: Secondary | ICD-10-CM

## 2023-06-29 MED ORDER — ESTRADIOL 0.1 MG/GM VA CREA: TOPICAL_CREAM | VAGINAL | 12 refills | Status: DC

## 2023-06-29 NOTE — Progress Notes (Signed)
  GYNECOLOGY  VISIT  CC:   vaginal dryness  HPI: 57 y.o. G2P2 Married White or Caucasian female here for problem gyn visit. Pt states she is in menopause and denies any vaginal spotting or bleeding. Hot flashes/night sweats not too bothersome. Her main complaint is vaginal dryness. When she and spouse try to have vaginal intercourse, vagina can feel "dry" and like "sandpaper". She has never used estrogen or estradiol vaginal ointment.  States she takes Unisom and Ambien nightly for sleep. Has not tried Gabapentin since she takes Ambien.   Past Medical History:  Diagnosis Date   Allergic rhinitis    right leg swelling   Back pain    Back pain    Chest pain    Edema    Heart burn    Insomnia    Racing heart beat     MEDS:   Current Outpatient Medications on File Prior to Visit  Medication Sig Dispense Refill   ALPRAZolam (XANAX) 0.5 MG tablet as needed.      Cholecalciferol (VITAMIN D PO) Take 10,000 Units by mouth daily.      gabapentin (NEURONTIN) 100 MG capsule Take 1 capsule qhs x 3 nights, then increase to 2 capsules qhs x 3 nights and then increase to 3 capsules nightly.  Then provider update. 60 capsule 0   Menaquinone-7 (VITAMIN K2 PO) Take by mouth.     Nutritional Supplements (DHEA PO) Take by mouth.     ondansetron (ZOFRAN-ODT) 8 MG disintegrating tablet Take 1 tablet (8 mg total) by mouth 3 (three) times daily as needed. Nausea and vomiting 20 tablet 1   SUMAtriptan (IMITREX) 100 MG tablet TAKE 1 TABLET BY MOUTH AT ONSET OF MIGRAINE. MAY REPEAT IN 2 HRS. MAX 2TABS/24HRS 11 tablet 3   vitamin B-12 (CYANOCOBALAMIN) 1000 MCG tablet Take by mouth.     zolpidem (AMBIEN) 5 MG tablet Take 1 tablet (5 mg total) by mouth at bedtime. 30 tablet 2   No current facility-administered medications on file prior to visit.   ALLERGIES: Penicillins  SH:  Lives with spouse  PHYSICAL EXAMINATION:    BP (!) 126/90 (BP Location: Left Arm, Patient Position: Sitting, Cuff Size: Normal)    Pulse 82   Ht 5\' 2"  (1.575 m)   Wt 163 lb 9.6 oz (74.2 kg)   BMI 29.92 kg/m     General appearance: alert, cooperative and appears stated age  Assessment/Plan: Vaginal Dryness r/t Menopause Discussed Estradiol vaginally at bedtime x 14 nights then change to twice weekly. Follow-up as scheduled for annual gyn exam. Letta Kocher

## 2023-07-27 ENCOUNTER — Other Ambulatory Visit (HOSPITAL_BASED_OUTPATIENT_CLINIC_OR_DEPARTMENT_OTHER): Payer: Self-pay | Admitting: Obstetrics & Gynecology

## 2023-07-27 DIAGNOSIS — R232 Flushing: Secondary | ICD-10-CM

## 2023-08-08 ENCOUNTER — Ambulatory Visit (HOSPITAL_BASED_OUTPATIENT_CLINIC_OR_DEPARTMENT_OTHER): Payer: Self-pay | Admitting: Obstetrics & Gynecology

## 2023-08-10 ENCOUNTER — Encounter (HOSPITAL_BASED_OUTPATIENT_CLINIC_OR_DEPARTMENT_OTHER): Payer: Self-pay | Admitting: Obstetrics & Gynecology

## 2023-08-17 ENCOUNTER — Telehealth: Payer: Self-pay | Admitting: Gastroenterology

## 2023-08-17 NOTE — Telephone Encounter (Signed)
Good afternoon [name]  The following patient is being referred to [place] for abdominal pain and has requested you as her physician. She had a colonoscopy done 5 years ago with [name] and cannot continue care because her insurance is not in network. Records are available on epic. Please review and advise of scheduling. Thank you.

## 2023-08-24 NOTE — Telephone Encounter (Signed)
OK to schedule in APP clinic as new patient RG

## 2023-09-02 ENCOUNTER — Other Ambulatory Visit (HOSPITAL_BASED_OUTPATIENT_CLINIC_OR_DEPARTMENT_OTHER): Payer: Self-pay | Admitting: Obstetrics & Gynecology

## 2023-09-05 ENCOUNTER — Encounter: Payer: Self-pay | Admitting: Physician Assistant

## 2023-09-07 ENCOUNTER — Other Ambulatory Visit (HOSPITAL_BASED_OUTPATIENT_CLINIC_OR_DEPARTMENT_OTHER): Payer: Self-pay | Admitting: Obstetrics & Gynecology

## 2023-09-07 DIAGNOSIS — R232 Flushing: Secondary | ICD-10-CM

## 2023-09-23 ENCOUNTER — Other Ambulatory Visit (HOSPITAL_BASED_OUTPATIENT_CLINIC_OR_DEPARTMENT_OTHER): Payer: Self-pay | Admitting: *Deleted

## 2023-09-23 DIAGNOSIS — Z1231 Encounter for screening mammogram for malignant neoplasm of breast: Secondary | ICD-10-CM

## 2023-10-26 ENCOUNTER — Encounter: Payer: Self-pay | Admitting: Physician Assistant

## 2023-10-26 ENCOUNTER — Other Ambulatory Visit: Payer: 59

## 2023-10-26 ENCOUNTER — Ambulatory Visit: Payer: No Typology Code available for payment source | Admitting: Physician Assistant

## 2023-10-26 VITALS — BP 138/84 | HR 78 | Ht 62.0 in | Wt 172.0 lb

## 2023-10-26 DIAGNOSIS — R1319 Other dysphagia: Secondary | ICD-10-CM

## 2023-10-26 DIAGNOSIS — Z860101 Personal history of adenomatous and serrated colon polyps: Secondary | ICD-10-CM

## 2023-10-26 DIAGNOSIS — R1011 Right upper quadrant pain: Secondary | ICD-10-CM

## 2023-10-26 DIAGNOSIS — K219 Gastro-esophageal reflux disease without esophagitis: Secondary | ICD-10-CM

## 2023-10-26 DIAGNOSIS — K5900 Constipation, unspecified: Secondary | ICD-10-CM

## 2023-10-26 DIAGNOSIS — R1013 Epigastric pain: Secondary | ICD-10-CM | POA: Diagnosis not present

## 2023-10-26 DIAGNOSIS — R1310 Dysphagia, unspecified: Secondary | ICD-10-CM

## 2023-10-26 DIAGNOSIS — K5904 Chronic idiopathic constipation: Secondary | ICD-10-CM

## 2023-10-26 NOTE — Patient Instructions (Signed)
 Your provider has requested that you go to the basement level for lab work before leaving today. Press "B" on the elevator. The lab is located at the first door on the left as you exit the elevator.  You have been scheduled for an endoscopy. Please follow written instructions given to you at your visit today.  If you use inhalers (even only as needed), please bring them with you on the day of your procedure.  If you take any of the following medications, they will need to be adjusted prior to your procedure:   DO NOT TAKE 7 DAYS PRIOR TO TEST- Trulicity (dulaglutide) Ozempic, Wegovy (semaglutide) Mounjaro (tirzepatide) Bydureon Bcise (exanatide extended release)  DO NOT TAKE 1 DAY PRIOR TO YOUR TEST Rybelsus (semaglutide) Adlyxin (lixisenatide) Victoza (liraglutide) Byetta (exanatide) ___________________________________________________________________________   You have been scheduled for a Barium Esophogram at [facility] Radiology (1st floor of the hospital) on 11/03/23 at 10 AM. Please arrive 30 minutes prior to your appointment for registration. Make certain not to have anything to eat or drink 3 hours prior to your test. If you need to reschedule for any reason, please contact radiology at [phone] to do so. __________________________________________________________________ A barium swallow is an examination that concentrates on views of the esophagus. This tends to be a double contrast exam (barium and two liquids which, when combined, create a gas to distend the wall of the oesophagus) or single contrast (non-ionic iodine based). The study is usually tailored to your symptoms so a good history is essential. Attention is paid during the study to the form, structure and configuration of the esophagus, looking for functional disorders (such as aspiration, dysphagia, achalasia, motility and reflux) EXAMINATION You may be asked to change into a gown, depending on the type of swallow  being performed. A radiologist and radiographer will perform the procedure. The radiologist will advise you of the type of contrast selected for your procedure and direct you during the exam. You will be asked to stand, sit or lie in several different positions and to hold a small amount of fluid in your mouth before being asked to swallow while the imaging is performed .In some instances you may be asked to swallow barium coated marshmallows to assess the motility of a solid food bolus. The exam can be recorded as a digital or video fluoroscopy procedure. POST PROCEDURE It will take 1-2 days for the barium to pass through your system. To facilitate this, it is important, unless otherwise directed, to increase your fluids for the next 24-48hrs and to resume your normal diet.  This test typically takes about 30 minutes to perform.  You have been scheduled for an abdominal ultrasound at [facility] Radiology (1st floor of hospital) on 11/03/23 at 8:30 AM. Please arrive 30 minutes prior to your appointment for registration. Make certain not to have anything to eat or drink AFTER MIDNIGHT. Should you need to reschedule your appointment, please contact radiology at [phone]. This test typically takes about 30 minutes to perform.  Avoid spicy and acidic foods Avoid fatty foods Limit your intake of coffee, tea, alcohol, and carbonated drinks Work to maintain a healthy weight Keep the head of the bed elevated at least 3 inches with blocks or a wedge pillow if you are having any nighttime symptoms Stay upright for 2 hours after eating Avoid meals and snacks three to four hours before bedtime  Recommend starting on a fiber supplement, can try metamucil first but if this causes gas/bloating switch to  benefiber or citracel, these do not cause gas.  Take with fiber with with a full 8 oz glass of water once a day. This can take 1 month to start helping, so try for at least one month.  Recommend increasing  water and physical activity.   Miralax is an osmotic laxative.  It only brings more water into the stool.  This is safe to take daily.  Can take up to 17 gram of miralax twice a day.  Mix with juice or coffee.  Start 1 capful at night for 3-4 days and reassess your response in 3-4 days.  You can increase and decrease the dose based on your response.  Remember, it can take up to 3-4 days to take effect OR for the effects to wear off.   I often pair this with benefiber in the morning to help assure the stool is not too loose.    - Drink at least 64-80 ounces of water/liquid per day. - Establish a time to try to move your bowels every day.  For many people, this is after a cup of coffee or after a meal such as breakfast. - Sit all of the way back on the toilet keeping your back fairly straight and while sitting up, try to rest the tops of your forearms on your upper thighs.   - Raising your feet with a step stool/squatty potty can be helpful to improve the angle that allows your stool to pass through the rectum. - Relax the rectum feeling it bulge toward the toilet water.  If you feel your rectum raising toward your body, you are contracting rather than relaxing. - Breathe in and slowly exhale. "Belly breath" by expanding your belly towards your belly button. Keep belly expanded as you gently direct pressure down and back to the anus.  A low pitched GRRR sound can assist with increasing intra-abdominal pressure.  (Can also trying to blow on a pinwheel and make it move, this helps with the same belly breathing) - Repeat 3-4 times. If unsuccessful, contract the pelvic floor to restore normal tone and get off the toilet.  Avoid excessive straining. - To reduce excessive wiping by teaching your anus to normally contract, place hands on outer aspect of knees and resist knee movement outward.  Hold 5-10 second then place hands just inside of knees and resist inward movement of knees.  Hold 5 seconds.   Repeat a few times each way.  Go to the ER if unable to pass gas, severe AB pain, unable to hold down food, any shortness of breath of chest pain.  Thank you for entrusting me with your care and choosing [facility].  Quentin Mulling PA-C

## 2023-10-26 NOTE — Progress Notes (Signed)
 10/26/2023 Charlotte Velazquez [id number] [birth year]  Referring provider: Medicine, [facility]* Primary GI doctor: [name]  ASSESSMENT AND PLAN:   Intermittent epigastric pain for a year with GERD, nausea, odynophagia, dysphagia can be with liquids No melena, weight loss PRN pepcid not helping, NSAIDS once a week, no ETOH Lifestyle changes discussed, avoid NSAIDS, ETOH, hand out given to the patient Will schedule barium swallow with issues with liquids, rule out dysmotility, achalasia, esophagitis Weight loss discussed with the patient I discussed risks of EGD with patient today, including risk of sedation, bleeding or perforation.  Patient provides understanding and gave verbal consent to proceed. Will get right RUQ [place] and consider HIDA Consider GES  RUQ/right flank pain CT renal stone 2023 with constipation, normal GB Will get RUQ [place] to rule out GB with nausea, more likely MSK, try salon pas patches, follow up with PCP Consider HIDA if negative  Constipation Mag citrate PRN and miralax as needed - Increase fiber/ water intake, decrease caffeine, increase activity level. -Will add on Miralax daily and Benefiber  Sessile polyp history 2018, recall placed for 03/2027    Patient Care Team: Medicine, [facility] as PCP - General (Family Medicine) Julio Sicks, NP as Referring Physician (Obstetrics and Gynecology)  HISTORY OF PRESENT ILLNESS: 58 y.o. female with a past medical history of GERD, hypothyroidism, insomnia and others listed below presents as a new patient for abdominal discomfort and globulus sensation with feelings of dysphagia.  Previously seen by Dr. Loreta Velazquez, contacted our office and requested transfer of care which [name] accepted. 03/28/2017 colonoscopy with Dr. Loreta Velazquez good prep with GoLytely small sessile polyp in the rectum, normal TI 09/30/2021 CT renal stone study with moderate stool burden otherwise unremarkable  Discussed the use  of AI scribe software for clinical note transcription with the patient, who gave verbal consent to proceed.  History of Present Illness   The patient, with a history of constipation, presents with a year-long history of intermittent abdominal discomfort, described as an 'obstruction' sensation, primarily on the right side of the abdomen. The discomfort is not associated with back pain. She describes the sensation as wanting to 'press it down.' The discomfort is not constant but comes and goes. She has also experienced nausea, particularly after consuming certain foods, such as steak, which she describes as feeling like a 'rock' in her stomach.  In addition to the abdominal discomfort, the patient has had multiple episodes of difficulty swallowing, even with liquids. She describes it as painful and has taken Benadryl in the past, suspecting an allergic reaction. She has not noticed any correlation between the swallowing difficulty and the abdominal discomfort.  The patient also reports a persistent pain on her side, which has been previously investigated with an MRI of the spine, suspecting referred pain. The patient has been managing occasional heartburn with Pepcid as needed, primarily at night. She also takes anti-inflammatories such as Tylenol and ibuprofen approximately once a week as needed.  The patient denies any significant weight loss, blood in the stool, or alcohol use. She has a history of constipation, which has improved in the past month without any significant changes in diet or lifestyle, although she has used MiraLAX and maxitrate occasionally. She also takes phentermine sporadically, which she notes can affect her blood pressure.      She  reports that she has quit smoking. She has never used smokeless tobacco. She reports current alcohol use. She reports that she does not use  drugs.  RELEVANT GI HISTORY, LABS, IMAGING:  CBC    Component Value Date/Time   WBC 8.2 04/06/2011 2236    RBC 4.62 04/06/2011 2236   HGB 13.0 04/06/2011 2236   HCT 38.8 04/06/2011 2236   PLT 223 04/06/2011 2236   MCV 84.0 04/06/2011 2236   MCH 28.1 04/06/2011 2236   MCHC 33.5 04/06/2011 2236   RDW 13.4 04/06/2011 2236   No results for input(s): "HGB" in the last 8760 hours.  CMP     Component Value Date/Time   NA 139 12/13/2022 1347   K 4.1 12/13/2022 1347   CL 103 12/13/2022 1347   CO2 24 12/13/2022 1347   GLUCOSE 88 12/13/2022 1347   BUN 12 12/13/2022 1347   CREATININE 0.84 12/13/2022 1347   CALCIUM 9.3 12/13/2022 1347   PROT 6.7 12/13/2022 1347   ALBUMIN 4.1 12/13/2022 1347   AST 19 12/13/2022 1347   ALT 20 12/13/2022 1347   ALKPHOS 89 12/13/2022 1347   BILITOT 0.3 12/13/2022 1347      Latest Ref Rng & Units 12/13/2022    1:47 PM  Hepatic Function  Total Protein 6.0 - 8.5 g/dL 6.7   Albumin 3.8 - 4.9 g/dL 4.1   AST 0 - 40 IU/L 19   ALT 0 - 32 IU/L 20   Alk Phosphatase 44 - 121 IU/L 89   Total Bilirubin 0.0 - 1.2 mg/dL 0.3       Current Medications:   Current Outpatient Medications (Endocrine & Metabolic):    THYROID PO, Take 90 mg by mouth daily.    Current Outpatient Medications (Analgesics):    SUMAtriptan (IMITREX) 100 MG tablet, TAKE 1 TABLET BY MOUTH AT ONSET OF MIGRAINE. MAY REPEAT IN 2 HRS. MAX 2TABS/24HRS (Patient taking differently: Take 100 mg by mouth. TAKE 1 TABLET BY MOUTH AT ONSET OF MIGRAINE. MAY REPEAT IN 2 HRS. MAX 2TABS/24HRS)  Current Outpatient Medications (Hematological):    vitamin B-12 (CYANOCOBALAMIN) 1000 MCG tablet, Take by mouth.  Current Outpatient Medications (Other):    ALPRAZolam (XANAX) 0.5 MG tablet, as needed.    Cholecalciferol (VITAMIN D PO), Take 10,000 Units by mouth daily.    estradiol (ESTRACE) 0.1 MG/GM vaginal cream, Insert 1 gram nightly for 14 nights then change to twice weekly   famotidine (PEPCID) 20 MG tablet, Take 20 mg by mouth as needed for heartburn or indigestion. OTC   gabapentin (NEURONTIN) 100 MG  capsule, TAKE 1 CAPSULE AT BEDTIME X 3 NIGHTS, THEN INCREASE TO 2 CAPSULES AT BEDTIME X 3 NIGHTS AND THEN INCREASE TO 3 CAPSULES NIGHTLY. THEN PROVIDER UPDATE. (Patient taking differently: Take 300 mg by mouth at bedtime.)   Menaquinone-7 (VITAMIN K2 PO), Take by mouth.   Multiple Vitamin (MULTIVITAMIN) capsule, Take 1 capsule by mouth daily as needed.   ondansetron (ZOFRAN-ODT) 8 MG disintegrating tablet, Take 1 tablet (8 mg total) by mouth 3 (three) times daily as needed. Nausea and vomiting   phentermine (ADIPEX-P) 37.5 MG tablet, Take 37.5 mg by mouth daily as needed.   triamcinolone cream (KENALOG) 0.1 %, Apply 1 Application topically 2 (two) times daily as needed.   zolpidem (AMBIEN) 5 MG tablet, Take 1 tablet (5 mg total) by mouth at bedtime.  Medical History:  Past Medical History:  Diagnosis Date   Allergic rhinitis    right leg swelling   Back pain    Chest pain    Edema    Heart burn    Hyperlipidemia    Insomnia  Racing heart beat    Thyroid disease    Allergies:  Allergies  Allergen Reactions   Penicillins Rash    Childhood reactions     Surgical History:  She  has a past surgical history that includes Wisdom tooth extraction and Breast surgery (10/2014). Family History:  Her family history includes Atrial fibrillation in her mother; Cancer in an other family member; Cancer (age of onset: 39) in her paternal aunt; Cancer (age of onset: 55) in her mother; Dementia in her mother; Heart attack in her father, maternal grandfather, and paternal grandfather; Kidney disease in her mother; Multiple sclerosis in her brother; Parkinson's disease in her mother.  REVIEW OF SYSTEMS  : All other systems reviewed and negative except where noted in the History of Present Illness.  PHYSICAL EXAM: BP 138/84   Pulse 78   Ht 5\' 2"  (1.575 m)   Wt 172 lb (78 kg)   LMP 08/13/2021   SpO2 98%   BMI 31.46 kg/m  General Appearance: Well nourished, in no apparent distress. Head:    Normocephalic and atraumatic. Eyes:  sclerae anicteric,conjunctive pink  Respiratory: Respiratory effort normal, BS equal bilaterally without rales, rhonchi, wheezing. Cardio: RRR with no MRGs. Peripheral pulses intact.  Abdomen: Soft,  Obese ,active bowel sounds. mild tenderness in the epigastrium. Without guarding and Without rebound. No masses. Rectal: Not evaluated Musculoskeletal: Full ROM, Normal gait. Without edema. Skin:  Dry and intact without significant lesions or rashes Neuro: Alert and  oriented x4;  No focal deficits. Psych:  Cooperative. Normal mood and affect.    Doree Albee, PA-C 9:22 AM

## 2023-10-27 LAB — TISSUE TRANSGLUTAMINASE, IGA: (tTG) Ab, IgA: <1 U/mL

## 2023-10-27 LAB — IGA: Immunoglobulin A: 188 mg/dL (ref 47–310)

## 2023-10-31 ENCOUNTER — Telehealth: Payer: Self-pay | Admitting: Physician Assistant

## 2023-10-31 NOTE — Telephone Encounter (Signed)
 Patient called and stated that she is needing her progress notes and the Doctor's order for her insurance company. I advised patient that they would need to speak to medical records. Patient stated that she would like to nurse to call her back, just to get some clarity. Patient is requesting a call back. Please advise.

## 2023-10-31 NOTE — Telephone Encounter (Signed)
 Pt saw Marchelle Folks Collier-will send to her nurse

## 2023-11-01 NOTE — Telephone Encounter (Signed)
 Pt stated that she called medical records and they have assisted her with her needs. Pt verbalized understanding with all questions answered.

## 2023-11-03 ENCOUNTER — Other Ambulatory Visit (HOSPITAL_COMMUNITY): Payer: 59

## 2023-11-03 ENCOUNTER — Ambulatory Visit (HOSPITAL_COMMUNITY): Payer: No Typology Code available for payment source

## 2023-11-04 ENCOUNTER — Telehealth: Payer: Self-pay | Admitting: Gastroenterology

## 2023-11-04 NOTE — Telephone Encounter (Signed)
 Returned patient call & she stated her insurance company "[employer]" is requiring that orders for barium swallow, ultrasound, and EGD be sent to them in order to cover out of pocket expenses. She is going to find a good fax number & send [place] a mychart message. Last seen with [place], [place] 10/26/23.

## 2023-11-04 NOTE — Progress Notes (Signed)
 Agree with assessment/plan.  Edman Circle, MD [facility] [phone]

## 2023-11-04 NOTE — Telephone Encounter (Signed)
 Patient called and stated that she is doing this program with her insurance regarding her care whenever she gets a procedure done and is needing the doctors order for the colonoscopy. Patient is requesting a call back. Please advise.

## 2023-11-08 NOTE — Telephone Encounter (Signed)
 Orders were faxed to number as requested. Pt made aware. Pt verbalized understanding with all questions answered.

## 2023-11-08 NOTE — Telephone Encounter (Signed)
 Patient called back with Fax #[phone] Attn: Santina Evans

## 2023-11-15 ENCOUNTER — Ambulatory Visit (HOSPITAL_COMMUNITY): Payer: No Typology Code available for payment source

## 2023-11-15 ENCOUNTER — Other Ambulatory Visit (HOSPITAL_COMMUNITY): Payer: Self-pay

## 2023-11-17 ENCOUNTER — Other Ambulatory Visit (HOSPITAL_BASED_OUTPATIENT_CLINIC_OR_DEPARTMENT_OTHER): Payer: Self-pay | Admitting: Obstetrics & Gynecology

## 2023-11-17 DIAGNOSIS — R232 Flushing: Secondary | ICD-10-CM

## 2023-11-29 ENCOUNTER — Ambulatory Visit (HOSPITAL_COMMUNITY): Payer: Self-pay

## 2023-11-29 ENCOUNTER — Inpatient Hospital Stay (HOSPITAL_COMMUNITY): Admission: RE | Admit: 2023-11-29 | Payer: Self-pay | Source: Ambulatory Visit

## 2023-12-14 ENCOUNTER — Telehealth: Payer: Self-pay

## 2023-12-14 NOTE — Telephone Encounter (Signed)
 Called patient advised EGD procedure need to be rescheduled per PA team  'the insurance is still pending medical director" no answer left message on patient voice mail to call office back.

## 2023-12-15 ENCOUNTER — Encounter: Payer: 59 | Admitting: Gastroenterology

## 2023-12-24 ENCOUNTER — Other Ambulatory Visit (HOSPITAL_BASED_OUTPATIENT_CLINIC_OR_DEPARTMENT_OTHER): Payer: Self-pay | Admitting: Obstetrics & Gynecology

## 2023-12-24 DIAGNOSIS — R232 Flushing: Secondary | ICD-10-CM

## 2023-12-26 NOTE — Telephone Encounter (Signed)
 Called and spoke with patient to get and update on the medication. Patient states that the gabapentin  is not her preferred choice. She takes this medication mostly on the weekends. Advised patient that I would let Dr. Annabell Key know and get back with her on Dr. Kassie Pais recommendations and thoughts. tbw

## 2024-01-02 NOTE — Telephone Encounter (Signed)
 LMOM for patient to call office back. Would like to relay message per Dr. Annabell Key. tbw

## 2024-01-03 ENCOUNTER — Telehealth: Payer: Self-pay | Admitting: Physician Assistant

## 2024-01-03 MED ORDER — PANTOPRAZOLE SODIUM 40 MG PO TBEC: 40.0000 mg | DELAYED_RELEASE_TABLET | Freq: Every day | ORAL | 3 refills | Status: AC

## 2024-01-03 NOTE — Telephone Encounter (Signed)
 12/30/2023 right upper quadrant ultrasound for reflux and epigastric discomfort showed normal bile ducts normal gallbladder did show fatty liver otherwise unremarkable  - Gallbladder normal thought most likely right upper quadrant pain was musculoskeletal related try Salonpas patches if EGD is unremarkable consider HIDA scan if continues to have pain and nausea.  12/30/2023 barium swallow showed severe gastroesophageal reflux moderate esophageal dysmotility and nonspecific mildly prominent distal esophageal B ring noted but without significant obstruction of contrast flow no hiatal hernia I do not see where barium tablet was used. Should very significant reflux this can sometimes cause a dysmotility or cause your esophagus not to move as well and cause some your symptoms. There was a slight ring noted distally in the esophagus as well. I do think we need to try to reschedule that EGD which was canceled I am uncertain why. -Please  have patient take 30 days of pantoprazole 40 mg once daily in the morning 30 minutes an hour before food. (Sent in to her pharmacy.) -Reschedule EGd

## 2024-01-04 NOTE — Telephone Encounter (Signed)
 Called patient back to offer [place]. Patient states right now she is hardly taking the gabapentin . She is currently taking zolpidem  and unisom. The combination of the two drugs are currently working for her and she will let us  know if she needs to do something different. tbw

## 2024-02-17 ENCOUNTER — Encounter (HOSPITAL_BASED_OUTPATIENT_CLINIC_OR_DEPARTMENT_OTHER): Payer: Self-pay | Admitting: Obstetrics & Gynecology

## 2024-02-17 ENCOUNTER — Other Ambulatory Visit (HOSPITAL_COMMUNITY)
Admission: RE | Admit: 2024-02-17 | Discharge: 2024-02-17 | Disposition: A | Discharge status: Home / Self Care | Source: Ambulatory Visit | Attending: Obstetrics & Gynecology | Admitting: Obstetrics & Gynecology

## 2024-02-17 ENCOUNTER — Ambulatory Visit (HOSPITAL_BASED_OUTPATIENT_CLINIC_OR_DEPARTMENT_OTHER): Payer: Self-pay | Admitting: Obstetrics & Gynecology

## 2024-02-17 VITALS — BP 133/90 | HR 68 | Ht 62.0 in | Wt 171.0 lb

## 2024-02-17 DIAGNOSIS — N952 Postmenopausal atrophic vaginitis: Secondary | ICD-10-CM | POA: Diagnosis not present

## 2024-02-17 DIAGNOSIS — Z124 Encounter for screening for malignant neoplasm of cervix: Secondary | ICD-10-CM

## 2024-02-17 DIAGNOSIS — Z01419 Encounter for gynecological examination (general) (routine) without abnormal findings: Secondary | ICD-10-CM

## 2024-02-17 DIAGNOSIS — Z1211 Encounter for screening for malignant neoplasm of colon: Secondary | ICD-10-CM

## 2024-02-17 DIAGNOSIS — Z9189 Other specified personal risk factors, not elsewhere classified: Secondary | ICD-10-CM

## 2024-02-17 DIAGNOSIS — R232 Flushing: Secondary | ICD-10-CM | POA: Diagnosis not present

## 2024-02-17 DIAGNOSIS — K760 Fatty (change of) liver, not elsewhere classified: Secondary | ICD-10-CM

## 2024-02-17 MED ORDER — ESTRADIOL 0.1 MG/GM VA CREA: TOPICAL_CREAM | VAGINAL | 12 refills | Status: AC

## 2024-02-17 MED ORDER — GABAPENTIN 300 MG PO CAPS: 300.0000 mg | ORAL_CAPSULE | Freq: Every evening | ORAL | 3 refills | Status: DC | PRN

## 2024-02-17 NOTE — Progress Notes (Signed)
 ANNUAL EXAM Patient name: Charlotte Velazquez MRN [id number]  Date of birth: Oct 24, 1965 Chief Complaint:   AEX  History of Present Illness:   Amijah Timothy is a 58 y.o. G2P2 Caucasian female being seen today for a routine annual exam.  Strong family hx of breast cancer with lifetime risk >34%.  Did not do MRI last year.  Ready to do it now.  Order will be placed.    Having some epigastric pain/issues.  Has ultrasound, endoscopy, esophageal stretching.  Has CT scheduled.   Having stress incontinence issues.  Considering sling procedure.  May need referral again, depending on when she goes back to urogyn.  Will let me know if needs referral.  Would like consult with nutritionist.  Frustrated with weight.  Has been diagnosed with fatty liver.  Does not want to be on a GLP1 medication.    Patient's last menstrual period was 08/13/2021.   Last pap 11/14/2020. Results were: NILM w/ HRHPV negative. H/O abnormal pap: no Last mammogram: 06/24/2023  . Results were: normal. Family h/o breast cancer: yes --mother Last colonoscopy: 03/28/2017 . Results were: normal. Family h/o colorectal cancer: no     06/29/2023    9:01 AM 02/04/2023    8:32 AM 11/20/2021   11:02 AM 08/10/2021    9:59 AM  Depression screen PHQ 2/9  Decreased Interest 0 0 0 0  Down, Depressed, Hopeless 0 0 0 0  PHQ - 2 Score 0 0 0 0    Review of Systems:   Pertinent items are noted in HPI  Denies any urinary symptoms or pelvic pain. Pertinent History Reviewed:  Reviewed past medical,surgical, social and family history.  Reviewed problem list, medications and allergies. Physical Assessment:   Vitals:   02/17/24 1002  BP: (!) 133/90  Pulse: 68  Weight: 171 lb (77.6 kg)  Height: 5' 2 (1.575 m)  Body mass index is 31.28 kg/m.        Physical Examination:   General appearance - well appearing, and in no distress  Mental status - alert, oriented to person, place, and time  Psych:  She has a normal mood and  affect  Skin - warm and dry, normal color, no suspicious lesions noted  Chest - effort normal, all lung fields clear to auscultation bilaterally  Heart - normal rate and regular rhythm  Neck:  midline trachea, no thyromegaly or nodules  Breasts - breasts appear normal, no suspicious masses, no skin or nipple changes or  axillary nodes  Abdomen - soft, nontender, nondistended, no masses or organomegaly  Pelvic - VULVA: normal appearing vulva with no masses, tenderness or lesions   VAGINA: normal appearing vagina with normal color and discharge, no lesions   CERVIX: normal appearing cervix without discharge or lesions, no CMT  Thin prep pap is done with HR HPV cotesting  UTERUS: uterus is felt to be normal size, shape, consistency and nontender   ADNEXA: No adnexal masses or tenderness noted.  Rectal - deferred  Extremities:  No swelling or varicosities noted  Chaperone present for exam  Assessment & Plan:  1. Well woman exam with routine gynecological exam (Primary) - Pap smear obtained today - Mammogram 06/2023.  MRI ordered - Colonoscopy up to date.  She requests cologuard order this year.  Aware this may not be covered by insurance - lab work done with PCP - vaccines reviewed/updated  2. Cervical cancer screening - Cytology - PAP( [place])  3. Increased risk of breast cancer -  MR BREAST BILATERAL W WO CONTRAST INC CAD; Future  4. Colon cancer screening - Cologuard  5. Hot flashes - gabapentin  (NEURONTIN ) 300 MG capsule; Take 1 capsule (300 mg total) by mouth at bedtime as needed.  Dispense: 30 capsule; Refill: 3  6. Vaginal atrophy - estradiol  (ESTRACE ) 0.1 MG/GM vaginal cream; Insert 1 gram nightly for 14 nights then change to twice weekly  Dispense: 42.5 g; Refill: 12   7. Fatty liver - Referral to Nutrition and Diabetes Services   Meds:  Meds ordered this encounter  Medications   estradiol  (ESTRACE ) 0.1 MG/GM vaginal cream    Sig: Insert 1 gram nightly for 14  nights then change to twice weekly    Dispense:  42.5 g    Refill:  12   gabapentin  (NEURONTIN ) 300 MG capsule    Sig: Take 1 capsule (300 mg total) by mouth at bedtime as needed.    Dispense:  30 capsule    Refill:  3    Follow-up: Return in about 1 year (around 02/16/2025).  Lillian Rein, MD 02/17/2024 11:11 AM

## 2024-02-21 ENCOUNTER — Ambulatory Visit (INDEPENDENT_AMBULATORY_CARE_PROVIDER_SITE_OTHER): Admitting: Obstetrics and Gynecology

## 2024-02-21 ENCOUNTER — Encounter: Payer: Self-pay | Admitting: Obstetrics and Gynecology

## 2024-02-21 VITALS — BP 124/86 | HR 91

## 2024-02-21 DIAGNOSIS — R3915 Urgency of urination: Secondary | ICD-10-CM | POA: Diagnosis not present

## 2024-02-21 DIAGNOSIS — N393 Stress incontinence (female) (male): Secondary | ICD-10-CM | POA: Diagnosis not present

## 2024-02-21 NOTE — Progress Notes (Signed)
 [facility] Return Visit  SUBJECTIVE  History of Present Illness: Charlotte Velazquez is a 58 y.o. female seen in follow-up for SUI. Last seen Jan 2023.   Has leakage with sough/ sneeze, full bladder, lifting. She has urgency as well with a full bladder. Usually can hold her bladder for a few hours- if at work, voids 3-4 times. If she is at home, she voids more often, maybe 10-12 times because she is close to the bathroom. Not waking at night to urinate.   She did attend pelvic PT, and saw some improvement. Has used a disposable pessary occasionally.   Denies any feeling of vaginal bulge.   Drinks: 2 cups coffee, tea per day. Drinks 50oz water.   Past Medical History: Patient  has a past medical history of Allergic rhinitis, Back pain, Chest pain, Edema, Heart burn, Hyperlipidemia, Insomnia, Racing heart beat, and Thyroid  disease.   Past Surgical History: She  has a past surgical history that includes Wisdom tooth extraction and Breast surgery (10/2014).   Medications: She has a current medication list which includes the following prescription(s): alprazolam, vitamin d , estradiol , famotidine, gabapentin , menaquinone-7, multivitamin, ondansetron , pantoprazole , phentermine, sumatriptan , thyroid , triamcinolone cream, cyanocobalamin, and zolpidem .   Allergies: Patient is allergic to penicillins.   Social History: Patient  reports that she has quit smoking. She has never used smokeless tobacco. She reports current alcohol use. She reports that she does not use drugs.     OBJECTIVE     Physical Exam: Vitals:   02/21/24 1431  BP: 124/86  Pulse: 91   Gen: No apparent distress, A&O x 3.  Detailed Urogynecologic Evaluation:   CST positive Normal external genitalia. On speculum, normal vaginal mucosa and normal appearing cervix. On bimanual, uterus is small, mobile and nontender.   POP-Q  -3                                            Aa   -3                                            Ba  -8.5                                              C   3                                            Gh  3.5                                            Pb  9                                            tvl   -1.5  Ap  -1.5                                            Bp  -9                                              D      ASSESSMENT AND PLAN    Ms. Funk is a 58 y.o. with:  1. SUI (stress urinary incontinence, female)   2. Urinary urgency     Plan for surgery: Exam under anesthesia, midurethral sling, cystoscopy  - We reviewed the patient's specific anatomic and functional findings, with the assistance of diagrams, and together finalized the above procedure. The planned surgical procedures were discussed along with the surgical risks outlined below, which were also provided on a detailed handout. Additional treatment options including expectant management, conservative management, medical management were discussed where appropriate.  We reviewed the benefits and risks of each treatment option.  - Urinary urgency is not significant at this time. We discussed it may improve after the sling. Will plan for further treatment as needed.   General Surgical Risks: For all procedures, there are risks of bleeding, infection, damage to surrounding organs including but not limited to bowel, bladder, blood vessels, ureters and nerves, and need for further surgery if an injury were to occur. These risks are all low with minimally invasive surgery.   There are risks of numbness and weakness at any body site or buttock/rectal pain.  It is possible that baseline pain can be worsened by surgery, either with or without mesh. If surgery is vaginal, there is also a low risk of possible conversion to laparoscopy or open abdominal incision where indicated. Very rare risks include blood transfusion, blood clot, heart attack, pneumonia, or  death.   There is also a risk of short-term postoperative urinary retention with need to use a catheter. About half of patients need to go home from surgery with a catheter, which is then later removed in the office. The risk of long-term need for a catheter is very low. There is also a risk of worsening of overactive bladder.   Sling: The effectiveness of a midurethral vaginal mesh sling is approximately 85%, and thus, there will be times when you may leak urine after surgery, especially if your bladder is full or if you have a strong cough. There is a balance between making the sling tight enough to treat your leakage but not too tight so that you have long-term difficulty emptying your bladder. A mesh sling will not directly treat overactive bladder/urge incontinence and may worsen it.  There is an FDA safety notification on vaginal mesh procedures for prolapse but NOT mesh slings. We have extensive experience and training with mesh placement and we have close postoperative follow up to identify any potential complications from mesh. It is important to realize that this mesh is a permanent implant that cannot be easily removed. There are rare risks of mesh exposure (2-4%), pain with intercourse (0-7%), and infection (<1%). The risk of mesh exposure if more likely in a woman with risks for poor healing (prior radiation, poorly controlled diabetes, or immunocompromised). The risk of new or worsened chronic pain after mesh implant is more common in  women with baseline chronic pain and/or poorly controlled anxiety or depression. Approximately 2-4% of patients will experience longer-term post-operative voiding dysfunction that may require surgical revision of the sling. We also reviewed that postoperatively, her stream may not be as strong as before surgery.    - For preop Visit:  She is required to have a visit within 30 days of her surgery.    - Medical clearance: not required  - Anticoagulant use: No -  Medicaid Hysterectomy form: n/a - Accepts blood transfusion: Yes - Expected length of stay: outpatient  Request sent for surgery scheduling.   Arma Lamp, MD

## 2024-02-22 ENCOUNTER — Ambulatory Visit (HOSPITAL_BASED_OUTPATIENT_CLINIC_OR_DEPARTMENT_OTHER): Payer: Self-pay | Admitting: Obstetrics & Gynecology

## 2024-02-22 LAB — CYTOLOGY - PAP
Adequacy: ABSENT
Comment: NEGATIVE
Diagnosis: NEGATIVE
High risk HPV: NEGATIVE

## 2024-05-02 ENCOUNTER — Encounter: Payer: Self-pay | Admitting: *Deleted

## 2024-05-23 ENCOUNTER — Encounter (HOSPITAL_BASED_OUTPATIENT_CLINIC_OR_DEPARTMENT_OTHER): Payer: Self-pay | Admitting: Obstetrics & Gynecology

## 2024-06-03 ENCOUNTER — Other Ambulatory Visit (HOSPITAL_BASED_OUTPATIENT_CLINIC_OR_DEPARTMENT_OTHER): Payer: Self-pay | Admitting: Obstetrics & Gynecology

## 2024-06-03 DIAGNOSIS — R232 Flushing: Secondary | ICD-10-CM

## 2024-06-21 ENCOUNTER — Other Ambulatory Visit (HOSPITAL_BASED_OUTPATIENT_CLINIC_OR_DEPARTMENT_OTHER): Payer: Self-pay | Admitting: Obstetrics & Gynecology

## 2024-06-21 DIAGNOSIS — R232 Flushing: Secondary | ICD-10-CM

## 2024-07-16 ENCOUNTER — Encounter: Admitting: Obstetrics and Gynecology

## 2024-07-17 ENCOUNTER — Encounter: Payer: Self-pay | Admitting: Obstetrics and Gynecology

## 2024-07-19 NOTE — Telephone Encounter (Signed)
 Patient called LMOV returning my called.Called patient back left another voice message to call office regarding rescheduling her surgery and to discuss the pre approval authorization from her insurance company.

## 2024-07-31 ENCOUNTER — Ambulatory Visit (HOSPITAL_COMMUNITY): Admit: 2024-07-31 | Admitting: Obstetrics and Gynecology

## 2024-07-31 SURGERY — CREATION, URETHRAL SLING, RETROPUBIC APPROACH, USING POLYPROPYLENE TAPE
Anesthesia: General

## 2024-09-05 ENCOUNTER — Encounter: Admitting: Obstetrics and Gynecology

## 2024-09-17 ENCOUNTER — Encounter: Payer: Self-pay | Admitting: *Deleted

## 2025-02-18 ENCOUNTER — Ambulatory Visit (HOSPITAL_BASED_OUTPATIENT_CLINIC_OR_DEPARTMENT_OTHER): Payer: Self-pay | Admitting: Obstetrics & Gynecology
